# Patient Record
Sex: Female | Born: 1940
Health system: Southern US, Community
[De-identification: ages and names within clinical notes are randomized; demographics above are authoritative.]

## PROBLEM LIST (undated history)

## (undated) DIAGNOSIS — Z923 Personal history of irradiation: Secondary | ICD-10-CM

## (undated) DIAGNOSIS — M1990 Unspecified osteoarthritis, unspecified site: Secondary | ICD-10-CM

## (undated) DIAGNOSIS — C801 Malignant (primary) neoplasm, unspecified: Secondary | ICD-10-CM

## (undated) DIAGNOSIS — C50919 Malignant neoplasm of unspecified site of unspecified female breast: Secondary | ICD-10-CM

## (undated) DIAGNOSIS — E039 Hypothyroidism, unspecified: Secondary | ICD-10-CM

## (undated) HISTORY — PX: ORIF ANKLE FRACTURE: SUR919

## (undated) HISTORY — PX: ANKLE HARDWARE REMOVAL: SHX1149

---

## 1998-04-06 ENCOUNTER — Other Ambulatory Visit: Admission: RE | Admit: 1998-04-06 | Discharge: 1998-04-06 | Payer: Self-pay | Admitting: Obstetrics and Gynecology

## 2002-08-16 ENCOUNTER — Encounter: Payer: Self-pay | Admitting: Orthopedic Surgery

## 2002-08-16 ENCOUNTER — Encounter: Payer: Self-pay | Admitting: Emergency Medicine

## 2002-08-16 ENCOUNTER — Inpatient Hospital Stay (HOSPITAL_COMMUNITY): Admission: EM | Admit: 2002-08-16 | Discharge: 2002-08-19 | Payer: Self-pay | Admitting: Emergency Medicine

## 2002-11-25 ENCOUNTER — Ambulatory Visit (HOSPITAL_COMMUNITY): Admission: RE | Admit: 2002-11-25 | Discharge: 2002-11-25 | Payer: Self-pay | Admitting: Orthopedic Surgery

## 2003-09-05 ENCOUNTER — Other Ambulatory Visit: Admission: RE | Admit: 2003-09-05 | Discharge: 2003-09-05 | Payer: Self-pay | Admitting: Family Medicine

## 2003-10-22 ENCOUNTER — Encounter: Admission: RE | Admit: 2003-10-22 | Discharge: 2003-10-22 | Payer: Self-pay | Admitting: Family Medicine

## 2004-02-11 ENCOUNTER — Encounter (INDEPENDENT_AMBULATORY_CARE_PROVIDER_SITE_OTHER): Payer: Self-pay | Admitting: *Deleted

## 2004-02-11 ENCOUNTER — Ambulatory Visit (HOSPITAL_COMMUNITY): Admission: RE | Admit: 2004-02-11 | Discharge: 2004-02-11 | Payer: Self-pay | Admitting: *Deleted

## 2004-09-14 ENCOUNTER — Other Ambulatory Visit: Admission: RE | Admit: 2004-09-14 | Discharge: 2004-09-14 | Payer: Self-pay | Admitting: Family Medicine

## 2005-09-15 ENCOUNTER — Other Ambulatory Visit: Admission: RE | Admit: 2005-09-15 | Discharge: 2005-09-15 | Payer: Self-pay | Admitting: Family Medicine

## 2005-10-04 ENCOUNTER — Encounter: Admission: RE | Admit: 2005-10-04 | Discharge: 2005-10-04 | Payer: Self-pay | Admitting: Family Medicine

## 2006-04-25 ENCOUNTER — Other Ambulatory Visit: Admission: RE | Admit: 2006-04-25 | Discharge: 2006-04-25 | Payer: Self-pay | Admitting: Family Medicine

## 2006-09-18 ENCOUNTER — Other Ambulatory Visit: Admission: RE | Admit: 2006-09-18 | Discharge: 2006-09-18 | Payer: Self-pay | Admitting: Family Medicine

## 2006-10-13 ENCOUNTER — Encounter: Admission: RE | Admit: 2006-10-13 | Discharge: 2006-10-13 | Payer: Self-pay | Admitting: Family Medicine

## 2007-09-24 ENCOUNTER — Other Ambulatory Visit: Admission: RE | Admit: 2007-09-24 | Discharge: 2007-09-24 | Payer: Self-pay | Admitting: Family Medicine

## 2007-10-15 ENCOUNTER — Encounter: Admission: RE | Admit: 2007-10-15 | Discharge: 2007-10-15 | Payer: Self-pay | Admitting: Family Medicine

## 2008-06-14 ENCOUNTER — Emergency Department (HOSPITAL_COMMUNITY): Admission: EM | Admit: 2008-06-14 | Discharge: 2008-06-14 | Payer: Self-pay | Admitting: Family Medicine

## 2008-11-04 ENCOUNTER — Encounter: Admission: RE | Admit: 2008-11-04 | Discharge: 2008-11-04 | Payer: Self-pay | Admitting: Family Medicine

## 2009-10-19 ENCOUNTER — Other Ambulatory Visit: Admission: RE | Admit: 2009-10-19 | Discharge: 2009-10-19 | Payer: Self-pay | Admitting: Family Medicine

## 2010-10-13 ENCOUNTER — Other Ambulatory Visit: Payer: Self-pay | Admitting: Family Medicine

## 2010-10-13 DIAGNOSIS — Z1231 Encounter for screening mammogram for malignant neoplasm of breast: Secondary | ICD-10-CM

## 2010-10-27 ENCOUNTER — Ambulatory Visit
Admission: RE | Admit: 2010-10-27 | Discharge: 2010-10-27 | Disposition: A | Discharge status: Home / Self Care | Payer: Medicare Other | Source: Ambulatory Visit | Attending: Family Medicine | Admitting: Family Medicine

## 2010-10-27 DIAGNOSIS — Z1231 Encounter for screening mammogram for malignant neoplasm of breast: Secondary | ICD-10-CM

## 2010-10-29 ENCOUNTER — Other Ambulatory Visit: Payer: Self-pay | Admitting: Family Medicine

## 2010-10-29 DIAGNOSIS — R928 Other abnormal and inconclusive findings on diagnostic imaging of breast: Secondary | ICD-10-CM

## 2010-11-05 ENCOUNTER — Ambulatory Visit
Admission: RE | Admit: 2010-11-05 | Discharge: 2010-11-05 | Disposition: A | Discharge status: Home / Self Care | Payer: Medicare Other | Source: Ambulatory Visit | Attending: Family Medicine | Admitting: Family Medicine

## 2010-11-05 DIAGNOSIS — R928 Other abnormal and inconclusive findings on diagnostic imaging of breast: Secondary | ICD-10-CM

## 2011-01-07 NOTE — Op Note (Signed)
NAMEHAILEY, Erica Frazier                       ACCOUNT NO.:  [id number]   MEDICAL RECORD NO.:  [id number]                   PATIENT TYPE:  EMS   LOCATION:  ED                                   FACILITY:  [facility]   PHYSICIAN:  Ollen Gross, M.D.                 DATE OF BIRTH:  Aug 24, 1940   DATE OF PROCEDURE:  08/16/2002  DATE OF DISCHARGE:                                 OPERATIVE REPORT   PREOPERATIVE DIAGNOSES:  Left trimalleolar ankle fracture dislocation with  syndesmosis disruption.   POSTOPERATIVE DIAGNOSES:  Left trimalleolar ankle fracture dislocation with  syndesmosis disruption.   PROCEDURE:  Open reduction and internal fixation left trimalleolar ankle  fracture dislocation with syndesmosis fixation.   SURGEON:  Ollen Gross, M.D.   ASSISTANT:  Avel Peace, P.A.-C.   ANESTHESIA:  Spinal.   ESTIMATED BLOOD LOSS:  Minimal.   DRAINS:  None.   TOURNIQUET TIME:  70 minutes at 300 mmHg.   COMPLICATIONS:  None.   CONDITION:  Stable to recovery.   BRIEF CLINICAL NOTE:  Erica Frazier is a 70 year old female who slipped off  the step and sustained a severe trimalleolar ankle fracture dislocation with  a syndesmosis disruption. She was taken to the operating room for open  reduction and internal fixation.   DESCRIPTION OF PROCEDURE:  After successful administration of spinal  anesthetic, the tourniquet was placed high on the left thigh and left lower  extremity prepped and draped in the usual sterile fashion. The extremity was  wrapped in Esmarch, tourniquet inflated to 300 mmHg. An incision was made  over the lateral malleolus centered over the fracture. The fracture was  rather high and we did about a 6 cm incision. There was significant  comminution at the fracture site. We were able to get this anatomically  reduced and then place a six hole one third tubular plate centered over the  fracture. The bottom holes were filled with cortical screws 12 mm in length  and  the top three screws were then filled with appropriate length cortical  screws. The screw hole in the fracture site is not filled. Anatomic  reduction is confirmed on AP and lateral Fluoro spots. This effectively  reduced the joint. Under Fluoro guidance, I then grasped the distal fibula  and was able to open up the syndesmosis consistent with the syndesmosis  disruption shown on the preop x-rays. We subsequently addressed the medial  side by making an incision over the medial malleolus. She had a very unusual  sheer type fracture involving basically the entire outer cortex of the  medial malleolus. The inner cortex was fine. The joint was also fine. We  thoroughly irrigated the area and I was able to anatomically reduce this  outer fragment. I then drilled across parallel to the joint all the way to  the lateral side of the tibia and we placed a bicortical screw with a washer  and this maintained the anatomic reduction of the medial fragment and it was  extremely stable. Through the lateral incision, we then drilled for a 4  cortice syndesmosis screw. The length of the screw was 55 mm. The screw was  parallel to the joint right at the syndesmotic level. Effectively reduced  the fibula and then under Fluoro guidance I was unable to open up the  syndesmosis anymore. Both wounds were then thoroughly irrigated with saline  solution and on the medial side I repaired the tendon sheath over the  posterior tib which effectively reduced it back into the groove behind the  medial malleolus. We then closed the subcu with 2-0 Vicryl on both the  medial and lateral incisions and the skin was closed with interrupted 4-0  nylon. The tourniquet was released with a total time of 70 minutes. The  bulky sterile dressings were applied and she was placed into a posterior  splint. She was then awakened and transported to recovery in stable  condition.                                               Ollen Gross, M.D.    FA/MEDQ  D:  08/16/2002  T:  08/16/2002  Job:  [id number]

## 2011-01-07 NOTE — Op Note (Signed)
   NAMEJENNAVECIA, [name]                       ACCOUNT NO.:  [id number]   MEDICAL RECORD NO.:  [id number]                   PATIENT TYPE:  AMB   LOCATION:  DAY                                  FACILITY:  [facility]   PHYSICIAN:  Ollen Gross, M.D.                 DATE OF BIRTH:  [birth year]   DATE OF PROCEDURE:  11/25/2002  DATE OF DISCHARGE:                                 OPERATIVE REPORT   PREOPERATIVE DIAGNOSES:  Retained hardware, left ankle.   POSTOPERATIVE DIAGNOSES:  Retained hardware, left ankle.   PROCEDURE:  Hardware removal, left ankle.   SURGEON:  Ollen Gross, M.D.   ASSISTANT:  None.   ANESTHESIA:  Local with MAC.   ESTIMATED BLOOD LOSS:  Minimal.   DRAINS:  None.   COMPLICATIONS:  None.   TOURNIQUET TIME:  6 minutes at 250 mmHg   CONDITION:  Stable to recovery.   BRIEF CLINICAL NOTE:  [name] is a 70 year old female who had an ankle  fracture dislocation syndesmosis disruption about three months ago. We  treated her with ORIF and then a syndesmosis screw.  It is three months  postop now and time to remove the syndesmosis screw. She presents now for  that procedure.   DESCRIPTION OF PROCEDURE:  After successful administration of MAC  anesthetic, a tourniquet was placed around her left calf and left lower  extremity prepped and draped in the usual sterile fashion. I infiltrated the  subcu tissues over the lateral malleolus with 10 mL of 1% Xylocaine. We then  wrapped the ankle in Esmarch and inflated the tourniquet to 250. A small  incision was made over the distal most aspect of her previous incision. I  identified the syndesmosis screw and removed it with a small fragment  screwdriver. It was removed intact. We then let the tourniquet down with a  total time of 6 minutes. The skin was then closed with interrupted 4-0 nylon  after the wound was thoroughly irrigated. I then infiltrated 6 mL of 0.5%  Marcaine into the wound bed. A bulky sterile dressing  was applied. The  patient was awakened and transported to recovery in stable condition.                                               Ollen Gross, M.D.    FA/MEDQ  D:  11/25/2002  T:  11/25/2002  Job:  [id number]

## 2011-09-27 ENCOUNTER — Other Ambulatory Visit: Payer: Self-pay | Admitting: Family Medicine

## 2011-09-27 DIAGNOSIS — Z1231 Encounter for screening mammogram for malignant neoplasm of breast: Secondary | ICD-10-CM

## 2011-10-28 ENCOUNTER — Ambulatory Visit
Admission: RE | Admit: 2011-10-28 | Discharge: 2011-10-28 | Disposition: A | Discharge status: Home / Self Care | Payer: Medicare Other | Source: Ambulatory Visit | Attending: Family Medicine | Admitting: Family Medicine

## 2011-10-28 DIAGNOSIS — Z1231 Encounter for screening mammogram for malignant neoplasm of breast: Secondary | ICD-10-CM

## 2012-07-05 ENCOUNTER — Other Ambulatory Visit: Payer: Self-pay | Admitting: Family Medicine

## 2012-07-05 ENCOUNTER — Ambulatory Visit
Admission: RE | Admit: 2012-07-05 | Discharge: 2012-07-05 | Disposition: A | Discharge status: Home / Self Care | Payer: Medicare Other | Source: Ambulatory Visit | Attending: Family Medicine | Admitting: Family Medicine

## 2012-07-05 DIAGNOSIS — R52 Pain, unspecified: Secondary | ICD-10-CM

## 2012-11-28 ENCOUNTER — Other Ambulatory Visit: Payer: Self-pay

## 2012-11-28 DIAGNOSIS — Z1231 Encounter for screening mammogram for malignant neoplasm of breast: Secondary | ICD-10-CM

## 2012-12-10 ENCOUNTER — Other Ambulatory Visit (HOSPITAL_COMMUNITY)
Admission: RE | Admit: 2012-12-10 | Discharge: 2012-12-10 | Disposition: A | Discharge status: Home / Self Care | Payer: Medicare Other | Source: Ambulatory Visit | Attending: Family Medicine | Admitting: Family Medicine

## 2012-12-10 ENCOUNTER — Other Ambulatory Visit: Payer: Self-pay | Admitting: Family Medicine

## 2012-12-10 DIAGNOSIS — Z1151 Encounter for screening for human papillomavirus (HPV): Secondary | ICD-10-CM | POA: Insufficient documentation

## 2012-12-10 DIAGNOSIS — Z124 Encounter for screening for malignant neoplasm of cervix: Secondary | ICD-10-CM | POA: Insufficient documentation

## 2012-12-27 ENCOUNTER — Ambulatory Visit
Admission: RE | Admit: 2012-12-27 | Discharge: 2012-12-27 | Disposition: A | Discharge status: Home / Self Care | Payer: Medicare Other | Source: Ambulatory Visit

## 2012-12-27 DIAGNOSIS — Z1231 Encounter for screening mammogram for malignant neoplasm of breast: Secondary | ICD-10-CM

## 2014-01-14 ENCOUNTER — Other Ambulatory Visit: Payer: Self-pay

## 2014-01-14 DIAGNOSIS — Z1231 Encounter for screening mammogram for malignant neoplasm of breast: Secondary | ICD-10-CM

## 2014-01-20 ENCOUNTER — Ambulatory Visit
Admission: RE | Admit: 2014-01-20 | Discharge: 2014-01-20 | Disposition: A | Discharge status: Home / Self Care | Payer: 59 | Source: Ambulatory Visit

## 2014-01-20 ENCOUNTER — Encounter (INDEPENDENT_AMBULATORY_CARE_PROVIDER_SITE_OTHER): Payer: Self-pay

## 2014-01-20 DIAGNOSIS — Z1231 Encounter for screening mammogram for malignant neoplasm of breast: Secondary | ICD-10-CM

## 2014-08-07 ENCOUNTER — Other Ambulatory Visit: Payer: Self-pay | Admitting: Gastroenterology

## 2015-12-25 ENCOUNTER — Other Ambulatory Visit: Payer: Self-pay

## 2015-12-25 DIAGNOSIS — Z1231 Encounter for screening mammogram for malignant neoplasm of breast: Secondary | ICD-10-CM

## 2016-01-07 ENCOUNTER — Ambulatory Visit
Admission: RE | Admit: 2016-01-07 | Discharge: 2016-01-07 | Disposition: A | Discharge status: Home / Self Care | Payer: Medicare Other | Source: Ambulatory Visit

## 2016-01-07 DIAGNOSIS — Z1231 Encounter for screening mammogram for malignant neoplasm of breast: Secondary | ICD-10-CM

## 2016-08-22 DIAGNOSIS — Z923 Personal history of irradiation: Secondary | ICD-10-CM

## 2016-08-22 HISTORY — DX: Personal history of irradiation: Z92.3

## 2017-03-08 ENCOUNTER — Other Ambulatory Visit: Payer: Self-pay | Admitting: Family Medicine

## 2017-03-08 DIAGNOSIS — Z1231 Encounter for screening mammogram for malignant neoplasm of breast: Secondary | ICD-10-CM

## 2017-03-22 DIAGNOSIS — C801 Malignant (primary) neoplasm, unspecified: Secondary | ICD-10-CM

## 2017-03-22 HISTORY — DX: Malignant (primary) neoplasm, unspecified: C80.1

## 2017-03-23 ENCOUNTER — Ambulatory Visit
Admission: RE | Admit: 2017-03-23 | Discharge: 2017-03-23 | Disposition: A | Discharge status: Home / Self Care | Payer: Medicare Other | Source: Ambulatory Visit | Attending: Family Medicine | Admitting: Family Medicine

## 2017-03-23 DIAGNOSIS — Z1231 Encounter for screening mammogram for malignant neoplasm of breast: Secondary | ICD-10-CM

## 2017-03-24 ENCOUNTER — Other Ambulatory Visit: Payer: Self-pay | Admitting: Family Medicine

## 2017-03-24 DIAGNOSIS — R928 Other abnormal and inconclusive findings on diagnostic imaging of breast: Secondary | ICD-10-CM

## 2017-03-30 ENCOUNTER — Ambulatory Visit
Admission: RE | Admit: 2017-03-30 | Discharge: 2017-03-30 | Disposition: A | Discharge status: Home / Self Care | Payer: Medicare Other | Source: Ambulatory Visit | Attending: Family Medicine | Admitting: Family Medicine

## 2017-03-30 ENCOUNTER — Other Ambulatory Visit: Payer: Self-pay | Admitting: Family Medicine

## 2017-03-30 DIAGNOSIS — N6310 Unspecified lump in the right breast, unspecified quadrant: Secondary | ICD-10-CM

## 2017-03-30 DIAGNOSIS — R928 Other abnormal and inconclusive findings on diagnostic imaging of breast: Secondary | ICD-10-CM

## 2017-04-10 ENCOUNTER — Ambulatory Visit: Payer: Self-pay | Admitting: Surgery

## 2017-04-10 ENCOUNTER — Other Ambulatory Visit: Payer: Self-pay | Admitting: Surgery

## 2017-04-10 ENCOUNTER — Encounter (HOSPITAL_BASED_OUTPATIENT_CLINIC_OR_DEPARTMENT_OTHER): Payer: Self-pay | Admitting: *Deleted

## 2017-04-10 DIAGNOSIS — C50511 Malignant neoplasm of lower-outer quadrant of right female breast: Secondary | ICD-10-CM

## 2017-04-10 DIAGNOSIS — Z170 Estrogen receptor positive status [ER+]: Principal | ICD-10-CM

## 2017-04-10 NOTE — Progress Notes (Signed)
Ensure Presurgery drink given with instructions to complete by 0700 dos, hibiclens soap given with instructions, pt verbalized understanding.

## 2017-04-10 NOTE — H&P (Signed)
Erica Frazier 04/10/2017 9:19 AM Location: [facility] Patient #: [id number] DOB: 07/20/1941 Divorced / Language: English / Race: White Female  History of Present Illness (Ngozi Alvidrez A. Devlyn Parish MD; 04/10/2017 10:16 AM) Patient words: Patient sent at the request of Dr. Lin for abnormal mammogram. The patient went for screening mammogram and a 1.5 cm abnormality was detected right breast lower outer quadrant. Mammogram with ultrasound was done which showed a 1.5 cm spiculated mass. Core biopsy was done which showed invasive ductal carcinoma with DCIS ER positive PR positive HER-2/neu negative. Ki-67 was 60%. Patient denies any breast mass-discharge or other symptoms prior to this.  Family history of breast cancer. She is an artist and travels frequently.                    CLINICAL DATA: Possible mass in the posterior aspect of the lower outer right breast on a recent screening mammogram. EXAM: 2D DIGITAL DIAGNOSTIC RIGHT MAMMOGRAM WITH ADJUNCT TOMO ULTRASOUND RIGHT BREAST COMPARISON: Previous exam(s). ACR Breast Density Category b: There are scattered areas of fibroglandular density. FINDINGS: 2D and 3D spot compression views of the right breast confirm an approximately 1.5 cm irregular, spiculated mass in the posterior aspect of the lower outer quadrant of the breast. No other findings suspicious for malignancy are seen. On physical exam, there is an approximately 1.5 cm oval area of mild palpable soft tissue thickening in the 8 to 9 o'clock position of the right breast, 5 cm from the nipple. There are no palpable right axillary lymph nodes. Targeted ultrasound is performed, showing a 1.5 x 1.3 x 0.9 cm irregular, hypoechoic mass with posterior acoustical shadowing in the 8-9 o'clock position of the right breast, 5 cm from the nipple. This has some internal blood flow with power Doppler. Ultrasound of the right axilla demonstrated no abnormal appearing  right axillary lymph nodes. IMPRESSION: 1.5 cm mass in the 8-9 o'clock position of the right breast with imaging features highly suspicious for malignancy. RECOMMENDATION: Ultrasound-guided core needle biopsy of the 1.5 cm mass in the 8-9 o'clock position of the right breast. This has been discussed with the patient and is scheduled to follow. I have discussed the findings and recommendations with the patient. Results were also provided in writing at the conclusion of the visit. If applicable, a reminder letter will be sent to the patient regarding the next appointment. BI-RADS CATEGORY 5: Highly suggestive of malignancy. Electronically Signed By: Steven Reid M.D. On: 03/30/2017 09:44                  ADDITIONAL INFORMATION: PROGNOSTIC INDICATORS Results: IMMUNOHISTOCHEMICAL AND MORPHOMETRIC ANALYSIS PERFORMED MANUALLY Estrogen Receptor: 100%, POSITIVE, STRONG STAINING INTENSITY Progesterone Receptor: 100%, POSITIVE, STRONG STAINING INTENSITY Proliferation Marker Ki67: 60% REFERENCE RANGE ESTROGEN RECEPTOR NEGATIVE 0% POSITIVE =>1% REFERENCE RANGE PROGESTERONE RECEPTOR NEGATIVE 0% POSITIVE =>1% All controls stained appropriately JOHN PATRICK MD Pathologist, Electronic Signature ( Signed 04/04/2017) FLUORESCENCE IN-SITU HYBRIDIZATION Results: HER2 - NEGATIVE RATIO OF HER2/CEP17 SIGNALS 1.15 AVERAGE HER2 COPY NUMBER PER CELL 1.15 Reference Range: NEGATIVE HER2/CEP17 Ratio <2.0 and average HER2 copy number <4.0 EQUIVOCAL HER2/CEP17 Ratio <2.0 and average HER2 copy number >=4.0 and <6.0 1 of 3 FINAL for Macmaster, Anyia S ([id number]) ADDITIONAL INFORMATION:(continued) POSITIVE HER2/CEP17 Ratio >=2.0 or <2.0 and average HER2 copy number >=6.0 JOHN PATRICK MD Pathologist, Electronic Signature ( Signed 04/04/2017) FINAL DIAGNOSIS Diagnosis Breast, right, needle core biopsy, 8 o'clock - INVASIVE DUCTAL CARCINOMA. - DUCTAL CARCINOMA IN SITU. -  LYMPHOVASCULAR INVASION   IS IDENTIFIED. - SEE COMMENT. Microscopic Comment The carcinoma appears grade 1-2. A breast prognostic profile will be performed and the results reported separately. The results were called to [facility] on 03/31/17. (JBK:gt, 03/31/17) JOSHUA KISH MD Pathologist, Electronic Signature (Case signed 03/31/2017) Specimen Gross and Clinical Information Specimen Comment Mass - expect cancer Specimen(s) Obtained: Breast, right, needle core biopsy, 8 o'clock Gross Received in formalin (TIF not provided) labeled with the patient's name and "right breast 8 o'clock mass" are several fragmented cores of tan and yellow soft tissue, entirely submitted in one cassette. (AK:ah 03/30/17) Stain(s) used in Diagnosis: The following stain(s) were used in diagnosing the case: Her2 FISH, ER-ACIS, PR-ACIS, KI-67-ACIS. The control(s) stained appropriately. Disclaimer Estrogen receptor (6F11), immunohistochemical stains are performed on formalin fixed, paraffin embedded tissue using a 3,3"-diaminobenzidine (DAB) chromogen and Leica Bond Autostainer System. The staining intensity of the nucleus is scored manually and is reported as the percentage of tumor cell nuclei demonstrating specific nuclear staining.Specimens are fixed in 10% Neutral Buffered Formalin for at least 6 hours and up to 72 hours. These tests have not be validated on decalcified tissue. Results should be interpreted with caution given the possibility of false negative results on decalcified specimens. HER2 IQFISH pharmDX (code K5731) is a direct fluorescence in-situ hybridization assay designed to quantitatively determine HER2 gene amplification in formalin-fixed, paraffin-embedded tissue specimens. It is performed at [facility] and is reported using ASCO/CAP scoring criteria published in 2013. Ki-67 (MM1), immunohistochemical stains are performed on formalin fixed, paraffin embedded tissue using a  3,3"-diaminobenzidine (DAB) chromogen and Leica Bond Autostainer System. The staining intensity of the nucleus is scored manually and is reported as the percentage of tumor cell nuclei demonstrating specific nuclear staining.Specimens are fixed in 10% Neutral Buffered Formalin for at least 6 hours and up to 72 hours. These tests have not be validated on decalcified tissue. Results should be interpreted with caution given the possibility of false negative results on decalcified specimens. PR progesterone receptor (16), immunohistochemical stains are performed on formalin fixed, paraffin embedded tissue using a 3,3"-diaminobenzidine (DAB) chromogen and Leica Bond Autostainer 2 of 3 FINAL for Zuno, Bernadette S ([id number]) Disclaimer(continued) System. The staining intensity of the nucleus is scored manually and is reported as the percentage of tumor cell nuclei demonstrating specific nuclear staining.Specimens are fixed in 10% Neutral Buffered Formalin for at least 6 hours and up to 72 hours. These tests have not be validated on decalcified tissue. Results should be interpreted with caution given the possibility of false negative results on decalcified specimens. Report signed out from the following location(s) Technical Component was performed at [facility] [address],[place] [address].CLIA:[id number],CAP:[id number]., Interpretation was performed at [place] [address], [place], [place] [address].  The patient is a 76 year old female.   Past Surgical History (Christen Lambert, RMA; 04/10/2017 9:19 AM) No pertinent past surgical history  Diagnostic Studies History (Christen Lambert, RMA; 04/10/2017 9:19 AM) Colonoscopy 1-5 years ago Mammogram within last year Pap Smear 1-5 years ago  Medication History (Christen Lambert, RMA; 04/10/2017 9:21 AM) Levothyroxine Sodium (150MCG Tablet, Oral) Active. Levoxyl (150MCG Tablet, Oral)  Active. Multivitamins (Oral) Active. Medications Reconciled  Social History (Christen Lambert, RMA; 04/10/2017 9:19 AM) Alcohol use Occasional alcohol use. Caffeine use Carbonated beverages. No drug use Tobacco use Former smoker.  Family History (Christen Lambert, RMA; 04/10/2017 9:19 AM) Alcohol Abuse Mother. Hypertension Father, Mother.  Pregnancy / Birth History (Christen Lambert, RMA; 04/10/2017 9:19   AM) Age at menarche 14 years. Age of menopause 51-55 Gravida 1 Irregular periods Maternal age 21-25 Para 1  Other Problems (Christen Lambert, RMA; 04/10/2017 9:19 AM) No pertinent past medical history     Review of Systems (Christen Lambert RMA; 04/10/2017 9:20 AM) General Not Present- Appetite Loss, Chills, Fatigue, Fever, Night Sweats, Weight Gain and Weight Loss. Breast Not Present- Breast Mass, Breast Pain, Nipple Discharge and Skin Changes. Cardiovascular Not Present- Chest Pain, Difficulty Breathing Lying Down, Leg Cramps, Palpitations, Rapid Heart Rate, Shortness of Breath and Swelling of Extremities. Gastrointestinal Not Present- Abdominal Pain, Bloating, Bloody Stool, Change in Bowel Habits, Chronic diarrhea, Constipation, Difficulty Swallowing, Excessive gas, Gets full quickly at meals, Hemorrhoids, Indigestion, Nausea, Rectal Pain and Vomiting. Female Genitourinary Not Present- Frequency, Nocturia, Painful Urination, Pelvic Pain and Urgency. Neurological Not Present- Decreased Memory, Fainting, Headaches, Numbness, Seizures, Tingling, Tremor, Trouble walking and Weakness. Psychiatric Not Present- Anxiety, Bipolar, Change in Sleep Pattern, Depression, Fearful and Frequent crying. Hematology Not Present- Blood Thinners, Easy Bruising, Excessive bleeding, Gland problems, HIV and Persistent Infections.  Vitals (Christen Lambert RMA; 04/10/2017 9:21 AM) 04/10/2017 9:21 AM Weight: 158 lb Temp.: 97.5F  Pulse: 87 (Regular)  BP: 132/80 (Sitting, Left Arm,  Standard)      Physical Exam (Samariya Rockhold A. Olsen Mccutchan MD; 04/10/2017 10:17 AM)  General Mental Status-Alert. General Appearance-Consistent with stated age. Hydration-Well hydrated. Voice-Normal.  Head and Neck Head-normocephalic, atraumatic with no lesions or palpable masses. Trachea-midline. Thyroid Gland Characteristics - normal size and consistency.  Breast Note: Left breast normal. Right breast shows a vague density in the lower outer quadrant. This is about 1 cm mobile. No nipple discharge.  Cardiovascular Cardiovascular examination reveals -normal heart sounds, regular rate and rhythm with no murmurs and normal pedal pulses bilaterally.  Neurologic Neurologic evaluation reveals -alert and oriented x 3 with no impairment of recent or remote memory. Mental Status-Normal.  Musculoskeletal Normal Exam - Left-Upper Extremity Strength Normal and Lower Extremity Strength Normal. Normal Exam - Right-Upper Extremity Strength Normal and Lower Extremity Strength Normal.  Lymphatic Head & Neck  General Head & Neck Lymphatics: Bilateral - Description - Normal. Axillary  General Axillary Region: Bilateral - Description - Normal. Tenderness - Non Tender.    Assessment & Plan (Rebecca Cairns A. Kshawn Canal MD; 04/10/2017 10:18 AM)  BREAST CANCER, RIGHT (C50.911) Impression: Further medical and radiation oncology.  Patient has opted for a right breast lumpectomy with sentinel lymph node mapping. We discussed mastectomy with reconstruction as well. We discussed possible outcomes of cosmesis and potential complications of both types of operations. She is an active artist and travels and prefers Risk of lumpectomy include bleeding, infection, seroma, more surgery, use of seed/wire, wound care, cosmetic deformity and the need for other treatments, death , blood clots, death. Pt agrees to proceed. Risk of sentinel lymph node mapping include bleeding, infection, lymphedema,  shoulder pain. stiffness, dye allergy. cosmetic deformity , blood clots, death, need for more surgery. Pt agrees to proceed with breast conservation.  Current Plans You are being scheduled for surgery- Our schedulers will call you.  You should hear from our office's scheduling department within 5 working days about the location, date, and time of surgery. We try to make accommodations for patient's preferences in scheduling surgery, but sometimes the OR schedule or the surgeon's schedule prevents us from making those accommodations.  If you have not heard from our office ([phone]) in 5 working days, call the office and ask for your surgeon's nurse.  If you have other   questions about your diagnosis, plan, or surgery, call the office and ask for your surgeon's nurse.  Pt Education - [facility] Breast Cancer Information Given - [facility] "Breast Journey" Package We discussed the staging and pathophysiology of breast cancer. We discussed all of the different options for treatment for breast cancer including surgery, chemotherapy, radiation therapy, Herceptin, and antiestrogen therapy. We discussed a sentinel lymph node biopsy as she does not appear to having lymph node involvement right now. We discussed the performance of that with injection of radioactive tracer and blue dye. We discussed that she would have an incision underneath her axillary hairline. We discussed that there is a bout a 10-20% chance of having a positive node with a sentinel lymph node biopsy and we will await the permanent pathology to make any other first further decisions in terms of her treatment. One of these options might be to return to the operating room to perform an axillary lymph node dissection. We discussed about a 1-2% risk lifetime of chronic shoulder pain as well as lymphedema associated with a sentinel lymph node biopsy. We discussed the options for treatment of the breast cancer which included lumpectomy versus a  mastectomy. We discussed the performance of the lumpectomy with a wire placement. We discussed a 10-20% chance of a positive margin requiring reexcision in the operating room. We also discussed that she may need radiation therapy or antiestrogen therapy or both if she undergoes lumpectomy. We discussed the mastectomy and the postoperative care for that as well. We discussed that there is no difference in her survival whether she undergoes lumpectomy with radiation therapy or antiestrogen therapy versus a mastectomy. There is a slight difference in the local recurrence rate being 3-5% with lumpectomy and about 1% with a mastectomy. We discussed the risks of operation including bleeding, infection, possible reoperation. She understands her further therapy will be based on what her stages at the time of her operation.  Pt Education - breast cancer surgery: discussed with patient and provided information. Pt Education - ABC (After Breast Cancer) Class Info: discussed with patient and provided information. 

## 2017-04-11 ENCOUNTER — Encounter: Payer: Self-pay | Admitting: Radiation Oncology

## 2017-04-13 ENCOUNTER — Telehealth: Payer: Self-pay | Admitting: Hematology and Oncology

## 2017-04-13 ENCOUNTER — Encounter: Payer: Self-pay | Admitting: Hematology and Oncology

## 2017-04-13 NOTE — Telephone Encounter (Signed)
Appt has been scheduled for the pt to see Dr. [name] on 9/13 at 1pm. Pt aware to arrive 15 minutes early. Address and insurance verified. Letter mailed.

## 2017-04-18 ENCOUNTER — Ambulatory Visit
Admission: RE | Admit: 2017-04-18 | Discharge: 2017-04-18 | Disposition: A | Discharge status: Home / Self Care | Payer: Medicare Other | Source: Ambulatory Visit | Attending: Surgery | Admitting: Surgery

## 2017-04-18 DIAGNOSIS — C50511 Malignant neoplasm of lower-outer quadrant of right female breast: Secondary | ICD-10-CM

## 2017-04-18 DIAGNOSIS — Z170 Estrogen receptor positive status [ER+]: Principal | ICD-10-CM

## 2017-04-19 ENCOUNTER — Ambulatory Visit
Admission: RE | Admit: 2017-04-19 | Discharge: 2017-04-19 | Disposition: A | Discharge status: Home / Self Care | Payer: Medicare Other | Source: Ambulatory Visit | Attending: Surgery | Admitting: Surgery

## 2017-04-19 ENCOUNTER — Ambulatory Visit (HOSPITAL_BASED_OUTPATIENT_CLINIC_OR_DEPARTMENT_OTHER): Payer: Medicare Other | Admitting: Anesthesiology

## 2017-04-19 ENCOUNTER — Encounter (HOSPITAL_BASED_OUTPATIENT_CLINIC_OR_DEPARTMENT_OTHER)
Admission: RE | Disposition: A | Discharge status: Home / Self Care | Payer: Self-pay | Source: Ambulatory Visit | Attending: Surgery

## 2017-04-19 ENCOUNTER — Encounter (HOSPITAL_COMMUNITY)
Admission: RE | Admit: 2017-04-19 | Discharge: 2017-04-19 | Disposition: A | Discharge status: Home / Self Care | Payer: Medicare Other | Source: Ambulatory Visit | Attending: Surgery | Admitting: Surgery

## 2017-04-19 ENCOUNTER — Encounter (HOSPITAL_BASED_OUTPATIENT_CLINIC_OR_DEPARTMENT_OTHER): Payer: Self-pay | Admitting: Anesthesiology

## 2017-04-19 ENCOUNTER — Ambulatory Visit (HOSPITAL_BASED_OUTPATIENT_CLINIC_OR_DEPARTMENT_OTHER)
Admission: RE | Admit: 2017-04-19 | Discharge: 2017-04-19 | Disposition: A | Discharge status: Home / Self Care | Payer: Medicare Other | Source: Ambulatory Visit | Attending: Surgery | Admitting: Surgery

## 2017-04-19 DIAGNOSIS — Z803 Family history of malignant neoplasm of breast: Secondary | ICD-10-CM | POA: Diagnosis not present

## 2017-04-19 DIAGNOSIS — Z170 Estrogen receptor positive status [ER+]: Principal | ICD-10-CM

## 2017-04-19 DIAGNOSIS — C50511 Malignant neoplasm of lower-outer quadrant of right female breast: Secondary | ICD-10-CM | POA: Insufficient documentation

## 2017-04-19 DIAGNOSIS — Z87891 Personal history of nicotine dependence: Secondary | ICD-10-CM | POA: Diagnosis not present

## 2017-04-19 HISTORY — PX: BREAST LUMPECTOMY WITH RADIOACTIVE SEED AND SENTINEL LYMPH NODE BIOPSY: SHX6550

## 2017-04-19 HISTORY — DX: Unspecified osteoarthritis, unspecified site: M19.90

## 2017-04-19 HISTORY — DX: Hypothyroidism, unspecified: E03.9

## 2017-04-19 HISTORY — DX: Malignant (primary) neoplasm, unspecified: C80.1

## 2017-04-19 HISTORY — PX: BREAST LUMPECTOMY: SHX2

## 2017-04-19 SURGERY — BREAST LUMPECTOMY WITH RADIOACTIVE SEED AND SENTINEL LYMPH NODE BIOPSY
Anesthesia: General | Site: Breast | Laterality: Right

## 2017-04-19 MED ORDER — PROPOFOL 10 MG/ML IV BOLUS
INTRAVENOUS | Status: AC
  Filled 2017-04-19: qty 20

## 2017-04-19 MED ORDER — EPHEDRINE SULFATE 50 MG/ML IJ SOLN
INTRAMUSCULAR | Status: DC | PRN
  Administered 2017-04-19 (×2): 10 mg via INTRAVENOUS

## 2017-04-19 MED ORDER — BUPIVACAINE-EPINEPHRINE (PF) 0.25% -1:200000 IJ SOLN
INTRAMUSCULAR | Status: DC | PRN
Start: 2017-04-19 — End: 2017-04-19
  Administered 2017-04-19: 14 mL

## 2017-04-19 MED ORDER — GLYCOPYRROLATE 0.2 MG/ML IJ SOLN
INTRAMUSCULAR | Status: DC | PRN
  Administered 2017-04-19: 0.1 mg via INTRAVENOUS

## 2017-04-19 MED ORDER — TECHNETIUM TC 99M SULFUR COLLOID FILTERED
1.0000 | Freq: Once | INTRAVENOUS | Status: AC | PRN
  Administered 2017-04-19: 1 via INTRADERMAL

## 2017-04-19 MED ORDER — FENTANYL CITRATE (PF) 100 MCG/2ML IJ SOLN
50.0000 ug | INTRAMUSCULAR | Status: DC | PRN
  Administered 2017-04-19: 100 ug via INTRAVENOUS

## 2017-04-19 MED ORDER — CHLORHEXIDINE GLUCONATE CLOTH 2 % EX PADS: 6.0000 | MEDICATED_PAD | Freq: Once | CUTANEOUS | Status: DC

## 2017-04-19 MED ORDER — LACTATED RINGERS IV SOLN
INTRAVENOUS | Status: DC
  Administered 2017-04-19: 10:00:00 via INTRAVENOUS

## 2017-04-19 MED ORDER — PROPOFOL 10 MG/ML IV BOLUS
INTRAVENOUS | Status: DC | PRN
  Administered 2017-04-19: 150 mg via INTRAVENOUS

## 2017-04-19 MED ORDER — FENTANYL CITRATE (PF) 100 MCG/2ML IJ SOLN
INTRAMUSCULAR | Status: AC
  Filled 2017-04-19: qty 2

## 2017-04-19 MED ORDER — MEPERIDINE HCL 25 MG/ML IJ SOLN: 6.2500 mg | INTRAMUSCULAR | Status: DC | PRN

## 2017-04-19 MED ORDER — DEXAMETHASONE SODIUM PHOSPHATE 4 MG/ML IJ SOLN
INTRAMUSCULAR | Status: DC | PRN
  Administered 2017-04-19: 10 mg via INTRAVENOUS

## 2017-04-19 MED ORDER — HYDROCODONE-ACETAMINOPHEN 5-325 MG PO TABS: 1.0000 | ORAL_TABLET | Freq: Four times a day (QID) | ORAL | 0 refills | Status: DC | PRN

## 2017-04-19 MED ORDER — ROPIVACAINE HCL 5 MG/ML IJ SOLN
INTRAMUSCULAR | Status: DC | PRN
  Administered 2017-04-19: 25 mL via PERINEURAL

## 2017-04-19 MED ORDER — SCOPOLAMINE 1 MG/3DAYS TD PT72: 1.0000 | MEDICATED_PATCH | Freq: Once | TRANSDERMAL | Status: DC | PRN

## 2017-04-19 MED ORDER — LIDOCAINE HCL (CARDIAC) 20 MG/ML IV SOLN
INTRAVENOUS | Status: DC | PRN
  Administered 2017-04-19: 30 mg via INTRAVENOUS

## 2017-04-19 MED ORDER — ONDANSETRON HCL 4 MG/2ML IJ SOLN
INTRAMUSCULAR | Status: AC
  Filled 2017-04-19: qty 2

## 2017-04-19 MED ORDER — LIDOCAINE 2% (20 MG/ML) 5 ML SYRINGE
INTRAMUSCULAR | Status: AC
  Filled 2017-04-19: qty 5

## 2017-04-19 MED ORDER — MIDAZOLAM HCL 2 MG/2ML IJ SOLN
1.0000 mg | INTRAMUSCULAR | Status: DC | PRN
  Administered 2017-04-19 (×2): 1 mg via INTRAVENOUS

## 2017-04-19 MED ORDER — MIDAZOLAM HCL 2 MG/2ML IJ SOLN
INTRAMUSCULAR | Status: AC
  Filled 2017-04-19: qty 2

## 2017-04-19 MED ORDER — CELECOXIB 400 MG PO CAPS: 400.0000 mg | ORAL_CAPSULE | ORAL | Status: DC

## 2017-04-19 MED ORDER — FENTANYL CITRATE (PF) 100 MCG/2ML IJ SOLN: 25.0000 ug | INTRAMUSCULAR | Status: DC | PRN

## 2017-04-19 MED ORDER — PHENYLEPHRINE HCL 10 MG/ML IJ SOLN
INTRAMUSCULAR | Status: DC | PRN
  Administered 2017-04-19: 80 ug via INTRAVENOUS
  Administered 2017-04-19: 40 ug via INTRAVENOUS
  Administered 2017-04-19: 80 ug via INTRAVENOUS

## 2017-04-19 MED ORDER — FENTANYL CITRATE (PF) 100 MCG/2ML IJ SOLN
INTRAMUSCULAR | Status: DC | PRN
  Administered 2017-04-19: 100 ug via INTRAVENOUS

## 2017-04-19 MED ORDER — GABAPENTIN 300 MG PO CAPS
ORAL_CAPSULE | ORAL | Status: AC
  Filled 2017-04-19: qty 1

## 2017-04-19 MED ORDER — MIDAZOLAM HCL 5 MG/5ML IJ SOLN
INTRAMUSCULAR | Status: DC | PRN
  Administered 2017-04-19: 2 mg via INTRAVENOUS

## 2017-04-19 MED ORDER — EPHEDRINE 5 MG/ML INJ
INTRAVENOUS | Status: AC
  Filled 2017-04-19: qty 10

## 2017-04-19 MED ORDER — GABAPENTIN 300 MG PO CAPS
300.0000 mg | ORAL_CAPSULE | ORAL | Status: AC
  Administered 2017-04-19: 300 mg via ORAL

## 2017-04-19 MED ORDER — DEXTROSE 5 % IV SOLN: 3.0000 g | INTRAVENOUS | Status: DC

## 2017-04-19 MED ORDER — PHENYLEPHRINE 40 MCG/ML (10ML) SYRINGE FOR IV PUSH (FOR BLOOD PRESSURE SUPPORT)
PREFILLED_SYRINGE | INTRAVENOUS | Status: AC
  Filled 2017-04-19: qty 10

## 2017-04-19 MED ORDER — CEFAZOLIN SODIUM-DEXTROSE 2-4 GM/100ML-% IV SOLN
INTRAVENOUS | Status: AC
  Filled 2017-04-19: qty 100

## 2017-04-19 MED ORDER — DEXAMETHASONE SODIUM PHOSPHATE 10 MG/ML IJ SOLN
INTRAMUSCULAR | Status: AC
  Filled 2017-04-19: qty 1

## 2017-04-19 MED ORDER — CEFAZOLIN SODIUM-DEXTROSE 2-4 GM/100ML-% IV SOLN
2.0000 g | Freq: Once | INTRAVENOUS | Status: AC
  Administered 2017-04-19: 2 g via INTRAVENOUS

## 2017-04-19 MED ORDER — ACETAMINOPHEN 500 MG PO TABS
1000.0000 mg | ORAL_TABLET | ORAL | Status: AC
  Administered 2017-04-19: 1000 mg via ORAL

## 2017-04-19 MED ORDER — ACETAMINOPHEN 500 MG PO TABS
ORAL_TABLET | ORAL | Status: AC
  Filled 2017-04-19: qty 2

## 2017-04-19 SURGICAL SUPPLY — 51 items
ADH SKN CLS APL DERMABOND .7 (GAUZE/BANDAGES/DRESSINGS) ×1
APPLIER CLIP 9.375 MED OPEN (MISCELLANEOUS) ×3
APR CLP MED 9.3 20 MLT OPN (MISCELLANEOUS) ×1
BINDER BREAST LRG (GAUZE/BANDAGES/DRESSINGS) IMPLANT
BINDER BREAST XLRG (GAUZE/BANDAGES/DRESSINGS) ×2 IMPLANT
BLADE SURG 15 STRL LF DISP TIS (BLADE) ×1 IMPLANT
BLADE SURG 15 STRL SS (BLADE) ×3
CANISTER SUC SOCK COL 7IN (MISCELLANEOUS) IMPLANT
CANISTER SUCT 1200ML W/VALVE (MISCELLANEOUS) ×3 IMPLANT
CHLORAPREP W/TINT 26ML (MISCELLANEOUS) ×3 IMPLANT
CLIP APPLIE 9.375 MED OPEN (MISCELLANEOUS) ×1 IMPLANT
COVER BACK TABLE 60X90IN (DRAPES) ×3 IMPLANT
COVER MAYO STAND STRL (DRAPES) ×3 IMPLANT
COVER PROBE W GEL 5X96 (DRAPES) ×3 IMPLANT
DECANTER SPIKE VIAL GLASS SM (MISCELLANEOUS) IMPLANT
DERMABOND ADVANCED (GAUZE/BANDAGES/DRESSINGS) ×2
DERMABOND ADVANCED .7 DNX12 (GAUZE/BANDAGES/DRESSINGS) ×1 IMPLANT
DEVICE DUBIN W/COMP PLATE 8390 (MISCELLANEOUS) ×3 IMPLANT
DRAPE LAPAROSCOPIC ABDOMINAL (DRAPES) ×3 IMPLANT
DRAPE UTILITY XL STRL (DRAPES) ×3 IMPLANT
ELECT COATED BLADE 2.86 ST (ELECTRODE) ×3 IMPLANT
ELECT REM PT RETURN 9FT ADLT (ELECTROSURGICAL) ×3
ELECTRODE REM PT RTRN 9FT ADLT (ELECTROSURGICAL) ×1 IMPLANT
GLOVE BIOGEL PI IND STRL 7.0 (GLOVE) ×2 IMPLANT
GLOVE BIOGEL PI IND STRL 8 (GLOVE) ×1 IMPLANT
GLOVE BIOGEL PI INDICATOR 7.0 (GLOVE) ×4
GLOVE BIOGEL PI INDICATOR 8 (GLOVE) ×2
GLOVE ECLIPSE 6.5 STRL STRAW (GLOVE) ×3 IMPLANT
GLOVE ECLIPSE 8.0 STRL XLNG CF (GLOVE) ×3 IMPLANT
GOWN STRL REUS W/ TWL LRG LVL3 (GOWN DISPOSABLE) ×2 IMPLANT
GOWN STRL REUS W/TWL LRG LVL3 (GOWN DISPOSABLE) ×6
HEMOSTAT ARISTA ABSORB 3G PWDR (MISCELLANEOUS) IMPLANT
HEMOSTAT SNOW SURGICEL 2X4 (HEMOSTASIS) ×3 IMPLANT
KIT MARKER MARGIN INK (KITS) ×3 IMPLANT
NDL HYPO 25X1 1.5 SAFETY (NEEDLE) ×1 IMPLANT
NDL SAFETY ECLIPSE 18X1.5 (NEEDLE) IMPLANT
NEEDLE HYPO 18GX1.5 SHARP (NEEDLE)
NEEDLE HYPO 25X1 1.5 SAFETY (NEEDLE) ×3 IMPLANT
NS IRRIG 1000ML POUR BTL (IV SOLUTION) ×3 IMPLANT
PACK BASIN DAY SURGERY FS (CUSTOM PROCEDURE TRAY) ×3 IMPLANT
PENCIL BUTTON HOLSTER BLD 10FT (ELECTRODE) ×3 IMPLANT
SLEEVE SCD COMPRESS KNEE MED (MISCELLANEOUS) ×3 IMPLANT
SPONGE LAP 4X18 X RAY DECT (DISPOSABLE) ×3 IMPLANT
SUT MNCRL AB 4-0 PS2 18 (SUTURE) ×3 IMPLANT
SUT VICRYL 3-0 CR8 SH (SUTURE) ×3 IMPLANT
SYR CONTROL 10ML LL (SYRINGE) ×3 IMPLANT
TOWEL OR 17X24 6PK STRL BLUE (TOWEL DISPOSABLE) ×3 IMPLANT
TOWEL OR NON WOVEN STRL DISP B (DISPOSABLE) ×3 IMPLANT
TUBE CONNECTING 20'X1/4 (TUBING) ×1
TUBE CONNECTING 20X1/4 (TUBING) ×2 IMPLANT
YANKAUER SUCT BULB TIP NO VENT (SUCTIONS) ×3 IMPLANT

## 2017-04-19 NOTE — Anesthesia Procedure Notes (Signed)
Anesthesia Regional Block: Pectoralis block   Pre-Anesthetic Checklist: ,, timeout performed, Correct Patient, Correct Site, Correct Laterality, Correct Procedure, Correct Position, site marked, Risks and benefits discussed,  Surgical consent,  Pre-op evaluation,  At surgeon's request and post-op pain management  Laterality: Right  Prep: chloraprep       Needles:  Injection technique: Single-shot  Needle Type: Echogenic Needle     Needle Length: 5cm  Needle Gauge: 21     Additional Needles:   Procedures: ultrasound guided,,,,,,,,  Narrative:  Start time: 04/19/2017 10:02 AM End time: 04/19/2017 10:08 AM Injection made incrementally with aspirations every 5 mL.  Performed by: Personally  Anesthesiologist: Kainoah Bartosiewicz

## 2017-04-19 NOTE — Progress Notes (Signed)
Emotional support during breast injections °

## 2017-04-19 NOTE — H&P (View-Only) (Signed)
[place] 04/10/2017 9:19 AM Location: [facility] Patient #: [id number] DOB: April 09, 1941 Divorced / Language: Erica Frazier / Race: White Female  History of Present Illness Erica Frazier A. Erica Rhines MD; 04/10/2017 10:16 AM) Patient words: Patient sent at the request of Dr. Augustin Coupe for abnormal mammogram. The patient went for screening mammogram and a 1.5 cm abnormality was detected right breast lower outer quadrant. Mammogram with ultrasound was done which showed a 1.5 cm spiculated mass. Core biopsy was done which showed invasive ductal carcinoma with DCIS ER positive PR positive HER-2/neu negative. Ki-67 was 60%. Patient denies any breast mass-discharge or other symptoms prior to this.  Family history of breast cancer. 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                    CLINICAL DATA: Possible mass in the posterior aspect of the lower outer right breast on a recent screening mammogram. EXAM: 2D DIGITAL DIAGNOSTIC RIGHT MAMMOGRAM WITH ADJUNCT TOMO ULTRASOUND RIGHT BREAST COMPARISON: Previous exam(s). ACR Breast Density Category b: There are scattered areas of fibroglandular density. FINDINGS: 2D and 3D spot compression views of the right breast confirm an approximately 1.5 cm irregular, spiculated mass in the posterior aspect of the lower outer quadrant of the breast. No other findings suspicious for malignancy are seen. On physical exam, there is an approximately 1.5 cm oval area of mild palpable soft tissue thickening in the 8 to 9 o'clock position of the right breast, 5 cm from the nipple. There are no palpable right axillary lymph nodes. Targeted ultrasound is performed, showing a 1.5 x 1.3 x 0.9 cm irregular, hypoechoic mass with posterior acoustical shadowing in the 8-9 o'clock position of the right breast, 5 cm from the nipple. This has some internal blood flow with power Doppler. Ultrasound of the right axilla demonstrated no abnormal appearing  right axillary lymph nodes. IMPRESSION: 1.5 cm mass in the 8-9 o'clock position of the right breast with imaging features highly suspicious for malignancy. RECOMMENDATION: Ultrasound-guided core needle biopsy of the 1.5 cm mass in the 8-9 o'clock position of the right breast. This has been discussed with the patient and is scheduled to follow. I have discussed the findings and recommendations with the patient. Results were also provided in writing at the conclusion of the visit. If applicable, a reminder letter will be sent to the patient regarding the next appointment. BI-RADS CATEGORY 5: Highly suggestive of malignancy. Electronically Signed By: Erica Frazier M.D. On: 03/30/2017 09:44                  ADDITIONAL INFORMATION: PROGNOSTIC INDICATORS Results: IMMUNOHISTOCHEMICAL AND MORPHOMETRIC ANALYSIS PERFORMED MANUALLY Estrogen Receptor: 100%, POSITIVE, STRONG STAINING INTENSITY Progesterone Receptor: 100%, POSITIVE, STRONG STAINING INTENSITY Proliferation Marker Ki67: 60% REFERENCE RANGE ESTROGEN RECEPTOR NEGATIVE 0% POSITIVE =>1% REFERENCE RANGE PROGESTERONE RECEPTOR NEGATIVE 0% POSITIVE =>1% All controls stained appropriately Erica Laws MD Pathologist, Electronic Signature ( Signed 04/04/2017) FLUORESCENCE IN-SITU HYBRIDIZATION Results: HER2 - NEGATIVE RATIO OF HER2/CEP17 SIGNALS 1.15 AVERAGE HER2 COPY NUMBER PER CELL 1.15 Reference Range: NEGATIVE HER2/CEP17 Ratio <2.0 and average HER2 copy number <4.0 EQUIVOCAL HER2/CEP17 Ratio <2.0 and average HER2 copy number >=4.0 and <6.0 1 of 3 FINAL for Erica Frazier [phone]) ADDITIONAL INFORMATION:(continued) POSITIVE HER2/CEP17 Ratio >=2.0 or <2.0 and average HER2 copy number >=6.0 Erica Laws MD Pathologist, Electronic Signature ( Signed 04/04/2017) FINAL DIAGNOSIS Diagnosis Breast, right, needle core biopsy, 8 o'clock - INVASIVE DUCTAL CARCINOMA. - DUCTAL CARCINOMA IN SITU. -  LYMPHOVASCULAR INVASION  IS IDENTIFIED. - SEE COMMENT. Microscopic Comment The carcinoma appears grade 1-2. A breast prognostic profile will be performed and the results reported separately. The results were called to [facility] on 03/31/17. (JBK:gt, 03/31/17) Erica Cutter MD Pathologist, Electronic Signature (Case signed 03/31/2017) Specimen Gross and Clinical Information Specimen Comment Mass - expect cancer Specimen(s) Obtained: Breast, right, needle core biopsy, 8 o'clock Gross Received in formalin (TIF not provided) labeled with the patient's name and "right breast 8 o'clock mass" are several fragmented cores of tan and yellow soft tissue, entirely submitted in one cassette. (AK:ah 03/30/17) Stain(s) used in Diagnosis: The following stain(s) were used in diagnosing the case: Her2 FISH, ER-ACIS, PR-ACIS, KI-67-ACIS. The control(s) stained appropriately. Disclaimer Estrogen receptor (6F11), immunohistochemical stains are performed on formalin fixed, paraffin embedded tissue using a 3,3"-diaminobenzidine (DAB) chromogen and Leica Bond Autostainer System. The staining intensity of the nucleus is scored manually and is reported as the percentage of tumor cell nuclei demonstrating specific nuclear staining.Specimens are fixed in 10% Neutral Buffered Formalin for at least 6 hours and up to 72 hours. These tests have not be validated on decalcified tissue. Results should be interpreted with caution given the possibility of false negative results on decalcified specimens. HER2 IQFISH pharmDX (code [phone]) is a direct fluorescence in-situ hybridization assay designed to quantitatively determine HER2 gene amplification in formalin-fixed, paraffin-embedded tissue specimens. It is performed at [facility] and is reported using ASCO/CAP scoring criteria published in 2013. Ki-67 (MM1), immunohistochemical stains are performed on formalin fixed, paraffin embedded tissue using a  3,3"-diaminobenzidine (DAB) chromogen and Leica Bond Autostainer System. The staining intensity of the nucleus is scored manually and is reported as the percentage of tumor cell nuclei demonstrating specific nuclear staining.Specimens are fixed in 10% Neutral Buffered Formalin for at least 6 hours and up to 72 hours. These tests have not be validated on decalcified tissue. Results should be interpreted with caution given the possibility of false negative results on decalcified specimens. PR progesterone receptor (16), immunohistochemical stains are performed on formalin fixed, paraffin embedded tissue using a 3,3"-diaminobenzidine (DAB) chromogen and Leica Bond Autostainer 2 of 3 FINAL for LILIT, CINELLI ([id number]) Disclaimer(continued) System. The staining intensity of the nucleus is scored manually and is reported as the percentage of tumor cell nuclei demonstrating specific nuclear staining.Specimens are fixed in 10% Neutral Buffered Formalin for at least 6 hours and up to 72 hours. These tests have not be validated on decalcified tissue. Results should be interpreted with caution given the possibility of false negative results on decalcified specimens. Report signed out from the following location(s) Technical Component was performed at [facility]. [address],[place] [address].DVVO:[id number]., Interpretation was performed at [place], [place],  [address].  The patient is a 76 year old female.   Past Surgical History Erica Frazier, [place]; 04/10/2017 9:19 AM) No pertinent past surgical history  Diagnostic Studies History Erica Frazier, [place]; 04/10/2017 9:19 AM) Colonoscopy 1-5 years ago Mammogram within last year Pap Smear 1-5 years ago  Medication History Erica Frazier, RMA; 04/10/2017 9:21 AM) Levothyroxine Sodium (150MCG Tablet, Oral) Active. Levoxyl (150MCG Tablet, Oral)  Active. Multivitamins (Oral) Active. Medications Reconciled  Social History Erica Frazier, [place]; 04/10/2017 9:19 AM) Alcohol use Occasional alcohol use. Caffeine use Carbonated beverages. No drug use Tobacco use Former smoker.  Family History Erica Frazier, [place]; 04/10/2017 9:19 AM) Alcohol Abuse Mother. Hypertension Father, Mother.  Pregnancy / Birth History Erica Frazier, [place]; 04/10/2017 9:19  AM) Age at menarche 50 years. Age of menopause 93-55 Gravida 1 Irregular periods Maternal age 66-25 Para 1  Other Problems Erica Frazier, [place]; 04/10/2017 9:19 AM) No pertinent past medical history     Review of Systems Erica Frazier RMA; 04/10/2017 9:20 AM) General Not Present- Appetite Loss, Chills, Fatigue, Fever, Night Sweats, Weight Gain and Weight Loss. Breast Not Present- Breast Mass, Breast Pain, Nipple Discharge and Skin Changes. Cardiovascular Not Present- Chest Pain, Difficulty Breathing Lying Down, Leg Cramps, Palpitations, Rapid Heart Rate, Shortness of Breath and Swelling of Extremities. Gastrointestinal Not Present- Abdominal Pain, Bloating, Bloody Stool, Change in Bowel Habits, Chronic diarrhea, Constipation, Difficulty Swallowing, Excessive gas, Gets full quickly at meals, Hemorrhoids, Indigestion, Nausea, Rectal Pain and Vomiting. Female Genitourinary Not Present- Frequency, Nocturia, Painful Urination, Pelvic Pain and Urgency. Neurological Not Present- Decreased Memory, Fainting, Headaches, Numbness, Seizures, Tingling, Tremor, Trouble walking and Weakness. Psychiatric Not Present- Anxiety, Bipolar, Change in Sleep Pattern, Depression, Fearful and Frequent crying. Hematology Not Present- Blood Thinners, Easy Bruising, Excessive bleeding, Gland problems, HIV and Persistent Infections.  Vitals Erica Frazier RMA; 04/10/2017 9:21 AM) 04/10/2017 9:21 AM Weight: 158 lb Temp.: 97.24F  Pulse: 87 (Regular)  BP: 132/80 (Sitting, Left Arm,  Standard)      Physical Exam (Erica Heinze A. Avett Reineck MD; 04/10/2017 10:17 AM)  General Mental Status-Alert. General Appearance-Consistent with stated age. Hydration-Well hydrated. Voice-Normal.  Head and Neck Head-normocephalic, atraumatic with no lesions or palpable masses. Trachea-midline. Thyroid Gland Characteristics - normal size and consistency.  Breast Note: Left breast normal. Right breast shows a vague density in the lower outer quadrant. This is about 1 cm mobile. No nipple discharge.  Cardiovascular Cardiovascular examination reveals -normal heart sounds, regular rate and rhythm with no murmurs and normal pedal pulses bilaterally.  Neurologic Neurologic evaluation reveals -alert and oriented x 3 with no impairment of recent or remote memory. Mental Status-Normal.  Musculoskeletal Normal Exam - Left-Upper Extremity Strength Normal and Lower Extremity Strength Normal. Normal Exam - Right-Upper Extremity Strength Normal and Lower Extremity Strength Normal.  Lymphatic Head & Neck  General Head & Neck Lymphatics: Bilateral - Description - Normal. Axillary  General Axillary Region: Bilateral - Description - Normal. Tenderness - Non Tender.    Assessment & Plan (Erica Bhattacharyya A. Justice Aguirre MD; 04/10/2017 10:18 AM)  BREAST CANCER, RIGHT (C50.911) Impression: Further medical and radiation oncology.  Patient has opted for a right breast lumpectomy with sentinel lymph node mapping. We discussed mastectomy with reconstruction as well. We discussed possible outcomes of cosmesis and potential complications of both types of operations. 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 Pt agrees to proceed. Risk of sentinel lymph node mapping include bleeding, infection, lymphedema,  shoulder pain. stiffness, dye allergy. cosmetic deformity , blood clots, death, need for more surgery. Pt agrees to proceed with breast conservation.  Current Plans You are being scheduled for surgery- Our schedulers will call you.  You should hear from our office's scheduling department within 5 working days about the location, date, and time of surgery. We try to make accommodations for patient's preferences in scheduling surgery, but sometimes the OR schedule or the surgeon's schedule prevents [place] from making those accommodations.  If you have not heard from our office [phone]) in 5 working days, call the office and ask for your surgeon's nurse.  If you have other  questions about your diagnosis, plan, or surgery, call the office and ask for your surgeon's nurse.  Pt Education - [facility] Breast Cancer Information Given - [facility] "Breast Journey" Package We discussed the staging and pathophysiology of breast cancer. We discussed all of the different options for treatment for breast cancer including surgery, chemotherapy, radiation therapy, Herceptin, and antiestrogen therapy. We discussed a sentinel lymph node biopsy as she does not appear to having lymph node involvement right now. We discussed the performance of that with injection of radioactive tracer and blue dye. We discussed that she would have an incision underneath her axillary hairline. We discussed that there is a bout a 10-20% chance of having a positive node with a sentinel lymph node biopsy and we will await the permanent pathology to make any other first further decisions in terms of her treatment. One of these options might be to return to the operating room to perform an axillary lymph node dissection. We discussed about a 1-2% risk lifetime of chronic shoulder pain as well as lymphedema associated with a sentinel lymph node biopsy. We discussed the options for treatment of the breast cancer which included lumpectomy versus a  mastectomy. We discussed the performance of the lumpectomy with a wire placement. We discussed a 10-20% chance of a positive margin requiring reexcision in the operating room. We also discussed that she may need radiation therapy or antiestrogen therapy or both if she undergoes lumpectomy. We discussed the mastectomy and the postoperative care for that as well. We discussed that there is no difference in her survival whether she undergoes lumpectomy with radiation therapy or antiestrogen therapy versus a mastectomy. There is a slight difference in the local recurrence rate being 3-5% with lumpectomy and about 1% with a mastectomy. We discussed the risks of operation including bleeding, infection, possible reoperation. She understands her further therapy will be based on what her stages at the time of her operation.  Pt Education - breast cancer surgery: discussed with patient and provided information. Pt Education - ABC (After Breast Cancer) Class Info: discussed with patient and provided information.

## 2017-04-19 NOTE — Op Note (Signed)
Preoperative diagnosis: Stage I right  breast cancer lower -outer quadrant   Postoperative diagnosis: Same   Procedure: right  breast seed localized lumpectomy with right  Axillary deep  sentinel lymph node mapping  Surgeon: Erroll Luna M.D.   Anesthesia: LMA with pectoral block anesthesia  And local   EBL: 20 cc   Specimen: right  breast mass with clip and seed to pathology and two  axillary sentinel nodes  hot   Drains: None   Indications for procedure: Patient presents for treatment of her right  breast cancer. She has opted for breast conservation after lengthy discussion of treatment options to include breast conservation surgery and mastectomy and reconstruction. Risks, benefits and alternatives discussed with the patient.The procedure has been discussed with the patient. Alternatives to surgery have been discussed with the patient. Risks of surgery include bleeding, Infection, Seroma formation, death, and the need for further surgery. The patient understands and wishes to proceed. Sentinel lymph node mapping and dissection has been discussed with the patient. Risk of bleeding, Infection, Seroma formation, Additional procedures, Shoulder weakness , Shoulder stiffness, Nerve and blood vessel injury and reaction to the mapping dyes have been discussed. Alternatives to surgery have been discussed with the patient. The patient agrees to proceed.   Description of procedure: Patient underwent placement of right  breast seed placement in  radiology earlier in the week. She presents to the holding area and questions are answered. Patient underwent technetium sulfur colloid injection per protocol. Questions answered. Patient taken back to operating room and placed supine on the operating room table. Patient received 2 g of Ancef. After induction of LMA anesthesia right  breast was prepped and draped in a sterile fashion. Of note, patient had pectoral block by anesthesia prior to this. Neoprobe was  used to identify the radioactive seen in the right  lower-outer quadrant. Curvilinear incision made around lateral nipple  and dissection was carried around to excise all tissue around both the clip and seed. Radiograph showed the mass with gross negative margins. Both seed and clip were in the specimen. Specimen sent to pathology.   Neoprobe was switched to the technetium sulfur colloid setting. Hot spot identify the right axilla. Incision made in the inferior axillary hairline and dissection carried into the deep  axilla.  2 Hot and blue lymph node identified and excised. Background counts approached 0. Wound  was irrigated and closed with 3-0 Vicryl and 4-0 Monocryl. Lumpectomy site closed in a similar fashion. Dermabond applied. All final counts of sponge, needle and instruments found to be correct at this point. Patient awoke, taken to recovery in satisfactory condition.

## 2017-04-19 NOTE — Progress Notes (Signed)
Assisted Dr. Oddono with right, ultrasound guided, pectoralis block. Side rails up, monitors on throughout procedure. See vital signs in flow sheet. Tolerated Procedure well. 

## 2017-04-19 NOTE — Anesthesia Procedure Notes (Signed)
Procedure Name: LMA Insertion Date/Time: 04/19/2017 11:25 AM Performed by: Toula Moos L Pre-anesthesia Checklist: Patient identified, Emergency Drugs available, Suction available, Patient being monitored and Timeout performed Patient Re-evaluated:Patient Re-evaluated prior to induction Oxygen Delivery Method: Circle system utilized Preoxygenation: Pre-oxygenation with 100% oxygen Induction Type: IV induction Ventilation: Mask ventilation without difficulty LMA: LMA inserted LMA Size: 4.0 Number of attempts: 1 Airway Equipment and Method: Bite block Placement Confirmation: positive ETCO2 Tube secured with: Tape Dental Injury: Teeth and Oropharynx as per pre-operative assessment

## 2017-04-19 NOTE — Discharge Instructions (Signed)
[facility] °Office Phone Number [phone] ° °BREAST BIOPSY/ PARTIAL MASTECTOMY: POST OP INSTRUCTIONS ° °Always review your discharge instruction sheet given to you by the facility where your surgery was performed. ° °IF YOU HAVE DISABILITY OR FAMILY LEAVE FORMS, YOU MUST BRING THEM TO THE OFFICE FOR PROCESSING.  DO NOT GIVE THEM TO YOUR DOCTOR. ° °1. A prescription for pain medication may be given to you upon discharge.  Take your pain medication as prescribed, if needed.  If narcotic pain medicine is not needed, then you may take acetaminophen (Tylenol) or ibuprofen (Advil) as needed. °2. Take your usually prescribed medications unless otherwise directed °3. If you need a refill on your pain medication, please contact your pharmacy.  They will contact our office to request authorization.  Prescriptions will not be filled after 5pm or on week-ends. °4. You should eat very light the first 24 hours after surgery, such as soup, crackers, pudding, etc.  Resume your normal diet the day after surgery. °5. Most patients will experience some swelling and bruising in the breast.  Ice packs and a good support bra will help.  Swelling and bruising can take several days to resolve.  °6. It is common to experience some constipation if taking pain medication after surgery.  Increasing fluid intake and taking a stool softener will usually help or prevent this problem from occurring.  A mild laxative (Milk of Magnesia or Miralax) should be taken according to package directions if there are no bowel movements after 48 hours. °7. Unless discharge instructions indicate otherwise, you may remove your bandages 24-48 hours after surgery, and you may shower at that time.  You may have steri-strips (small skin tapes) in place directly over the incision.  These strips should be left on the skin for 7-10 days.  If your surgeon used skin glue on the incision, you may shower in 24 hours.  The glue will flake off over the  next 2-3 weeks.  Any sutures or staples will be removed at the office during your follow-up visit. °8. ACTIVITIES:  You may resume regular daily activities (gradually increasing) beginning the next day.  Wearing a good support bra or sports bra minimizes pain and swelling.  You may have sexual intercourse when it is comfortable. °a. You may drive when you no longer are taking prescription pain medication, you can comfortably wear a seatbelt, and you can safely maneuver your car and apply brakes. °b. RETURN TO WORK:  ______________________________________________________________________________________ °9. You should see your doctor in the office for a follow-up appointment approximately two weeks after your surgery.  Your doctor’s nurse will typically make your follow-up appointment when she calls you with your pathology report.  Expect your pathology report 2-3 business days after your surgery.  You may call to check if you do not hear from us after three days. °10. OTHER INSTRUCTIONS: _______________________________________________________________________________________________ _____________________________________________________________________________________________________________________________________ °_____________________________________________________________________________________________________________________________________ °_____________________________________________________________________________________________________________________________________ ° °WHEN TO CALL YOUR DOCTOR: °1. Fever over 101.0 °2. Nausea and/or vomiting. °3. Extreme swelling or bruising. °4. Continued bleeding from incision. °5. Increased pain, redness, or drainage from the incision. ° °The clinic staff is available to answer your questions during regular business hours.  Please don’t hesitate to call and ask to speak to one of the nurses for clinical concerns.  If you have a medical emergency, go to the nearest  emergency room or call 911.  A surgeon from [facility] is always on call at the hospital. ° °For further questions, please visit [email or web address]  ° ° ° ° °  Post Anesthesia Home Care Instructions ° °Activity: °Get plenty of rest for the remainder of the day. A responsible individual must stay with you for 24 hours following the procedure.  °For the next 24 hours, DO NOT: °-Drive a car °-Operate machinery °-Drink alcoholic beverages °-Take any medication unless instructed by your physician °-Make any legal decisions or sign important papers. ° °Meals: °Start with liquid foods such as gelatin or soup. Progress to regular foods as tolerated. Avoid greasy, spicy, heavy foods. If nausea and/or vomiting occur, drink only clear liquids until the nausea and/or vomiting subsides. Call your physician if vomiting continues. ° °Special Instructions/Symptoms: °Your throat may feel dry or sore from the anesthesia or the breathing tube placed in your throat during surgery. If this causes discomfort, gargle with warm salt water. The discomfort should disappear within 24 hours. ° °If you had a scopolamine patch placed behind your ear for the management of post- operative nausea and/or vomiting: ° °1. The medication in the patch is effective for 72 hours, after which it should be removed.  Wrap patch in a tissue and discard in the trash. Wash hands thoroughly with soap and water. °2. You may remove the patch earlier than 72 hours if you experience unpleasant side effects which may include dry mouth, dizziness or visual disturbances. °3. Avoid touching the patch. Wash your hands with soap and water after contact with the patch. °  ° °

## 2017-04-19 NOTE — Interval H&P Note (Signed)
History and Physical Interval Note:  04/19/2017 9:57 AM  Erica Frazier  has presented today for surgery, with the diagnosis of RIGHT BREAST CANCER  The various methods of treatment have been discussed with the patient and family. After consideration of risks, benefits and other options for treatment, the patient has consented to  Procedure(s): RIGHT BREAST LUMPECTOMY WITH RADIOACTIVE SEED AND RIGHT SENTINEL LYMPH NODE BIOPSY (Right) as a surgical intervention .  The patient's history has been reviewed, patient examined, no change in status, stable for surgery.  I have reviewed the patient's chart and labs.  Questions were answered to the patient's satisfaction.     Gyan Cambre A.

## 2017-04-19 NOTE — Anesthesia Preprocedure Evaluation (Signed)
Anesthesia Evaluation  Patient identified by MRN, date of birth, ID band Patient awake    Reviewed: Allergy & Precautions, NPO status , Patient's Chart, lab work & pertinent test results  Airway Mallampati: II  TM Distance: >3 FB Neck ROM: Full    Dental no notable dental hx.    Pulmonary neg pulmonary ROS,    Pulmonary exam normal breath sounds clear to auscultation       Cardiovascular negative cardio ROS Normal cardiovascular exam Rhythm:Regular Rate:Normal     Neuro/Psych negative neurological ROS  negative psych ROS   GI/Hepatic negative GI ROS, Neg liver ROS,   Endo/Other  negative endocrine ROSHypothyroidism   Renal/GU negative Renal ROS  negative genitourinary   Musculoskeletal negative musculoskeletal ROS (+)   Abdominal   Peds negative pediatric ROS (+)  Hematology negative hematology ROS (+)   Anesthesia Other Findings   Reproductive/Obstetrics negative OB ROS                             Anesthesia Physical Anesthesia Plan  ASA: II  Anesthesia Plan: General   Post-op Pain Management:    Induction: Intravenous  PONV Risk Score and Plan: 3 and Ondansetron, Dexamethasone, Midazolam and Treatment may vary due to age or medical condition  Airway Management Planned: LMA and Oral ETT  Additional Equipment:   Intra-op Plan:   Post-operative Plan:   Informed Consent:   Plan Discussed with: CRNA and Surgeon  Anesthesia Plan Comments: ( )        Anesthesia Quick Evaluation

## 2017-04-19 NOTE — Anesthesia Postprocedure Evaluation (Signed)
Anesthesia Post Note  Patient: Erica Frazier  Procedure(s) Performed: Procedure(s) (LRB): RIGHT BREAST LUMPECTOMY WITH RADIOACTIVE SEED AND RIGHT SENTINEL LYMPH NODE BIOPSY (Right)     Patient location during evaluation: PACU Anesthesia Type: General Level of consciousness: awake and alert Pain management: pain level controlled Vital Signs Assessment: post-procedure vital signs reviewed and stable Respiratory status: spontaneous breathing, nonlabored ventilation, respiratory function stable and patient connected to nasal cannula oxygen Cardiovascular status: blood pressure returned to baseline and stable Postop Assessment: no signs of nausea or vomiting Anesthetic complications: no    Last Vitals:  Vitals:   04/19/17 1245 04/19/17 1300  BP: (!) 108/55 (!) 99/55  Pulse: 83 78  Resp: 17 19  Temp:    SpO2: 98% 96%    Last Pain:  Vitals:   04/19/17 1315  TempSrc:   PainSc: 0-No pain                 Vinette Crites

## 2017-04-19 NOTE — Transfer of Care (Signed)
Immediate Anesthesia Transfer of Care Note  Patient: Erica Frazier  Procedure(s) Performed: Procedure(s): RIGHT BREAST LUMPECTOMY WITH RADIOACTIVE SEED AND RIGHT SENTINEL LYMPH NODE BIOPSY (Right)  Patient Location: PACU  Anesthesia Type:GA combined with regional for post-op pain  Level of Consciousness: sedated  Airway & Oxygen Therapy: Patient Spontanous Breathing and Patient connected to face mask oxygen  Post-op Assessment: Report given to RN and Post -op Vital signs reviewed and stable  Post vital signs: Reviewed and stable  Last Vitals:  Vitals:   04/19/17 1030 04/19/17 1045  BP: (!) 108/53 (!) 95/59  Pulse: (!) 57 (!) 53  Resp: 11 14  Temp:    SpO2: 100% 100%    Last Pain:  Vitals:   04/19/17 0924  TempSrc: Oral      Patients Stated Pain Goal: 0 (47/84/12 8208)  Complications: No apparent anesthesia complications

## 2017-04-20 ENCOUNTER — Encounter (HOSPITAL_BASED_OUTPATIENT_CLINIC_OR_DEPARTMENT_OTHER): Payer: Self-pay | Admitting: Surgery

## 2017-05-02 NOTE — Progress Notes (Signed)
Location of Breast Cancer:Right Breast Lower Outer Quadrant 8 o'clock position  Histology per Pathology Report: Diagnosis 03/30/2017: Breast, right, needle core biopsy, 8 o'clock - INVASIVE DUCTAL CARCINOMA.- DUCTAL CARCINOMA IN SITU.- LYMPHOVASCULAR INVASION IS IDENTIFIED  Receptor Status: ER(100%+), PR (100%+), Her2-neu (neg ratio=1.15), Ki-67(60%)  Did patient present with symptoms (if so, please note symptoms) or was this found on screening mammography?: Routine screening  Past/Anticipated interventions by surgeon, if OVF:IEPPIRJJO 04/19/17: Dr. Erroll Luna, MD 1. Breast, lumpectomy, Right - INVASIVE DUCTAL CARCINOMA, GRADE II/III, SPANNING 1.3 CM. - DUCTAL CARCINOMA IN SITU, INTERMEDIATE GRADE. - THE SURGICAL RESECTION MARGINS ARE NEGATIVE FOR CARCINOMA. - SEE ONCOLOGY TABLE BELOW. 2. Lymph node, sentinel, biopsy, Right axillary #1 - THERE IS NO EVIDENCE OF CARCINOMA IN 1 OF 1 LYMPH NODE (0/1). 3. Lymph node, sentinel, biopsy, Right axillary #2 - THERE IS NO EVIDENCE OF CARCINOMA IN 1 OF 1 LYMPH NODE (0/1).  Past/Anticipated interventions by medical oncology, if any: Chemotherapy   Lymphedema issues, if any: None  Pain issues, if any: Under her arm 3/10  SAFETY ISSUES:  Prior radiation? NO  Pacemaker/ICD? NO  Is the patient on methotrexate? No  Current Complaints / other details:  Divorced,, menarche age 19, G1P1,former cigarette smoker, occasional alcohol,   Vitals:   05/04/17 0839  BP: 126/72  Pulse: 82  Resp: 20  Temp: 98.1 F (36.7 C)  TempSrc: Oral  SpO2: 97%  Weight: 158 lb 6 oz (71.8 kg)   Wt Readings from Last 3 Encounters:  05/04/17 158 lb 6 oz (71.8 kg)  04/19/17 160 lb (72.6 kg)       Rebecca Eaton, RN 05/02/2017,10:34 AM

## 2017-05-04 ENCOUNTER — Encounter: Payer: Self-pay | Admitting: Radiation Oncology

## 2017-05-04 ENCOUNTER — Encounter: Payer: Self-pay | Admitting: *Deleted

## 2017-05-04 ENCOUNTER — Telehealth: Payer: Self-pay | Admitting: *Deleted

## 2017-05-04 ENCOUNTER — Ambulatory Visit
Admission: RE | Admit: 2017-05-04 | Discharge: 2017-05-04 | Disposition: A | Discharge status: Home / Self Care | Payer: Medicare Other | Source: Ambulatory Visit | Attending: Radiation Oncology | Admitting: Radiation Oncology

## 2017-05-04 ENCOUNTER — Ambulatory Visit (HOSPITAL_BASED_OUTPATIENT_CLINIC_OR_DEPARTMENT_OTHER): Payer: Medicare Other | Admitting: Hematology and Oncology

## 2017-05-04 VITALS — BP 126/72 | HR 82 | Temp 98.1°F | Resp 20 | Wt 158.4 lb

## 2017-05-04 DIAGNOSIS — C50511 Malignant neoplasm of lower-outer quadrant of right female breast: Secondary | ICD-10-CM | POA: Diagnosis not present

## 2017-05-04 DIAGNOSIS — E039 Hypothyroidism, unspecified: Secondary | ICD-10-CM | POA: Diagnosis not present

## 2017-05-04 DIAGNOSIS — C50411 Malignant neoplasm of upper-outer quadrant of right female breast: Secondary | ICD-10-CM | POA: Insufficient documentation

## 2017-05-04 DIAGNOSIS — M19049 Primary osteoarthritis, unspecified hand: Secondary | ICD-10-CM | POA: Insufficient documentation

## 2017-05-04 DIAGNOSIS — Z170 Estrogen receptor positive status [ER+]: Secondary | ICD-10-CM

## 2017-05-04 DIAGNOSIS — Z79890 Hormone replacement therapy (postmenopausal): Secondary | ICD-10-CM | POA: Insufficient documentation

## 2017-05-04 DIAGNOSIS — C50011 Malignant neoplasm of nipple and areola, right female breast: Secondary | ICD-10-CM

## 2017-05-04 DIAGNOSIS — Z510 Encounter for antineoplastic radiation therapy: Secondary | ICD-10-CM | POA: Insufficient documentation

## 2017-05-04 NOTE — Assessment & Plan Note (Signed)
04/19/2017: Right lumpectomy: IDC grade 2, 1.3 cm, intermediate grade DCIS, margins negative, 0/2 lymph nodes negative, ER 100%, PR 100%, HER-2 negative ratio 1.15, Ki-67 60%, T1 CN 0 stage IA  Pathology and radiology counseling:Discussed with the patient, the details of pathology including the type of breast cancer,the clinical staging, the significance of ER, PR and HER-2/neu receptors and the implications for treatment. After reviewing the pathology in detail, we proceeded to discuss the different treatment options between surgery, radiation, chemotherapy, antiestrogen therapies.  Recommendations: 1. Oncotype DX testing to determine if chemotherapy would be of any benefit followed by 2. Adjuvant radiation therapy followed by 3. Adjuvant antiestrogen therapy  Oncotype counseling: I discussed Oncotype DX test. I explained to the patient that this is a 21 gene panel to evaluate patient tumors DNA to calculate recurrence score. This would help determine whether patient has high risk or intermediate risk or low risk breast cancer. She understands that if her tumor was found to be high risk, she would benefit from systemic chemotherapy. If low risk, no need of chemotherapy. If she was found to be intermediate risk, we would need to evaluate the score as well as other risk factors and determine if an abbreviated chemotherapy may be of benefit.  Return to clinic based on Oncotype DX test result

## 2017-05-04 NOTE — Telephone Encounter (Signed)
Received order for oncotype testing. Requisition sent to pathology. Received by Lynelle Smoke

## 2017-05-04 NOTE — Progress Notes (Signed)
[facility] NOTE  Patient Care Team: [name], MD as PCP - General (Family Medicine)  CHIEF COMPLAINTS/PURPOSE OF CONSULTATION:  Newly diagnosed breast cancer  HISTORY OF PRESENTING ILLNESS:  Erica Frazier 76 y.o. female is here because of recent diagnosis of right breast cancer. Patient had a routine screening mammogram the detected abnormality in the right breast which was evaluated by ultrasound and biopsy. She underwent right lumpectomy on 04/19/2017 and that revealed invasive ductal carcinoma grade 2 and it measured 1.3 cm with intermediate grade DCIS. Margins were -0/2 lymph nodes were also negative. The tumor is ER/PR positive HER-2 negative with a Ki-67 60%. She was sent to [place] for discussion regarding adjuvant treatment options.  I reviewed her records extensively and collaborated the history with the patient.  SUMMARY OF ONCOLOGIC HISTORY:   Malignant neoplasm of upper-outer quadrant of right breast in female, estrogen receptor positive ([place])   04/19/2017 Surgery    Right lumpectomy: IDC grade 2, 1.3 cm, intermediate grade DCIS, margins negative, 0/2 lymph nodes negative, ER 100%, PR 100%, HER-2 negative ratio 1.15, Ki-67 60%, T1 CN 0 stage IA       MEDICAL HISTORY:  Past Medical History:  Diagnosis Date  . Arthritis    osteoarthritis hands  . Cancer ([place]) 03/2017   DCIS right breast  . Hypothyroidism     SURGICAL HISTORY: Past Surgical History:  Procedure Laterality Date  . ANKLE HARDWARE REMOVAL Left   . BREAST LUMPECTOMY WITH RADIOACTIVE SEED AND SENTINEL LYMPH NODE BIOPSY Right 04/19/2017   Procedure: RIGHT BREAST LUMPECTOMY WITH RADIOACTIVE SEED AND RIGHT SENTINEL LYMPH NODE BIOPSY;  Surgeon: Erroll Luna, MD;  Location: [facility];  Service: General;  Laterality: Right;  . ORIF ANKLE FRACTURE Left     SOCIAL HISTORY: Social History   Social History  . Marital status: Divorced    Spouse name: N/A  . Number  of children: N/A  . Years of education: N/A   Occupational History  . Not on file.   Social History Main Topics  . Smoking status: Never Smoker  . Smokeless tobacco: Never Used  . Alcohol use Yes     Comment: social  . Drug use: No  . Sexual activity: Not on file   Other Topics Concern  . Not on file   Social History Narrative  . No narrative on file    FAMILY HISTORY: No family history on file.  ALLERGIES:  has No Known Allergies.  MEDICATIONS:  Current Outpatient Prescriptions  Medication Sig Dispense Refill  . levothyroxine (SYNTHROID, LEVOTHROID) 125 MCG tablet Take 125 mcg by mouth daily before breakfast.    . Multiple Vitamin (MULTIVITAMIN WITH MINERALS) TABS tablet Take 1 tablet by mouth daily.     No current facility-administered medications for this visit.     REVIEW OF SYSTEMS:   Constitutional: Denies fevers, chills or abnormal night sweats Eyes: Denies blurriness of vision, double vision or watery eyes Ears, nose, mouth, throat, and face: Denies mucositis or sore throat Respiratory: Denies cough, dyspnea or wheezes Cardiovascular: Denies palpitation, chest discomfort or lower extremity swelling Gastrointestinal:  Denies nausea, heartburn or change in bowel habits Skin: Denies abnormal skin rashes Lymphatics: Denies new lymphadenopathy or easy bruising Neurological:Denies numbness, tingling or new weaknesses Behavioral/Psych: Mood is stable, no new changes  Breast:Right lumpectomy All other systems were reviewed with the patient and are negative.  PHYSICAL EXAMINATION: ECOG PERFORMANCE STATUS: 1 - Symptomatic but completely ambulatory  Vitals:  05/04/17 1028  BP: 120/65  Pulse: 66  Resp: 18  Temp: 97.6 F (36.4 C)  SpO2: 99%   Filed Weights   05/04/17 1028  Weight: 158 lb 4.8 oz (71.8 kg)    GENERAL:alert, no distress and comfortable SKIN: skin color, texture, turgor are normal, no rashes or significant lesions EYES: normal, conjunctiva  are pink and non-injected, sclera clear OROPHARYNX:no exudate, no erythema and lips, buccal mucosa, and tongue normal  NECK: supple, thyroid normal size, non-tender, without nodularity LYMPH:  no palpable lymphadenopathy in the cervical, axillary or inguinal LUNGS: clear to auscultation and percussion with normal breathing Frazier HEART: regular rate & rhythm and no murmurs and no lower extremity edema ABDOMEN:abdomen soft, non-tender and normal bowel sounds Musculoskeletal:no cyanosis of digits and no clubbing  PSYCH: alert & oriented x 3 with fluent speech NEURO: no focal motor/sensory deficits  RADIOGRAPHIC STUDIES: I have personally reviewed the radiological reports and agreed with the findings in the report.  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 After reviewing the pathology in detail, we proceeded to discuss the different treatment options between surgery, radiation, chemotherapy, antiestrogen therapies.  Recommendations: 1. Oncotype DX testing to determine if chemotherapy would be of any benefit followed by 2. Adjuvant radiation therapy followed by 3. Adjuvant antiestrogen therapy  Oncotype counseling: I discussed Oncotype DX test. I explained to the patient that this is a 21 gene panel to evaluate patient tumors DNA to calculate recurrence score. This would help determine whether  patient has high risk or intermediate risk or low risk breast cancer. She understands that if her tumor was found to be high risk, she would benefit from systemic chemotherapy. If low risk, no need of chemotherapy. If she was found to be intermediate risk, we would need to evaluate the score as well as other risk factors and determine if an abbreviated chemotherapy may be of benefit.  Return to clinic based on Oncotype DX test result   All questions were answered. The patient knows to call the clinic with any problems, questions or concerns.    Rulon Eisenmenger, MD 05/04/17

## 2017-05-04 NOTE — Progress Notes (Signed)
Radiation Oncology         [phone] ________________________________  Name: Erica Frazier        MRN: [id number]  Date of Service: 05/04/2017 DOB: 06/17/1941  VO:ZDGUY, Butch Penny, MD  Erroll Luna, MD     REFERRING PHYSICIAN: Erroll Luna, MD   DIAGNOSIS: The primary encounter diagnosis was Malignant neoplasm of lower-outer quadrant of right breast of female, estrogen receptor positive ([place]). A diagnosis of Malignant neoplasm of nipple of right breast in female, estrogen receptor positive ([place]) was also pertinent to this visit.   HISTORY OF PRESENT ILLNESS: Erica Frazier is a 76 y.o. female seen at the request of Dr. Brantley Stage for a recently diagnosed right breast cancer. The patient was seen after screening mammogram revealed a possible mass in the right breast on 03/23/17. She underwent diagnostic imaging including ultrasound which measured thei son 03/30/17 to be 1.5 x 1.3 x .9 cm at 8-9 o'clock, and her axilla did not reveal adenopathy. She underwent biopsy on 03/30/17 that revealed a grade 2, ER/PR positive, HER2 negative Ki67 60% invasive ductal carcinoma with LVSI. She subseuqently underwent lumpectomy and sentinel node evaluation on 04/19/17. Final pathology revealed a 1.3 cm grade 2 invasive ductal carcinoma with intermediate grade DCIS. Margins were negative, as were her 2 sampled nodes. She comes today to discuss options of therapy. She will be meeting with Dr. [name] today as well.   PREVIOUS RADIATION THERAPY: No   PAST MEDICAL HISTORY:  Past Medical History:  Diagnosis Date  . Arthritis    osteoarthritis hands  . Cancer ([place]) 03/2017   DCIS right breast  . Hypothyroidism        PAST SURGICAL HISTORY: Past Surgical History:  Procedure Laterality Date  . ANKLE HARDWARE REMOVAL Left   . BREAST LUMPECTOMY WITH RADIOACTIVE SEED AND SENTINEL LYMPH NODE BIOPSY Right 04/19/2017   Procedure: RIGHT BREAST LUMPECTOMY WITH RADIOACTIVE SEED AND RIGHT SENTINEL LYMPH NODE  BIOPSY;  Surgeon: Erroll Luna, MD;  Location: [facility];  Service: General;  Laterality: Right;  . ORIF ANKLE FRACTURE Left      FAMILY HISTORY: No family history on file.   SOCIAL HISTORY:  reports that she has never smoked. She has never used smokeless tobacco. She reports that she drinks alcohol. She reports that she does not use drugs. The patient is divorced and lives in [place]. 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  ALLERGIES: Patient has no known allergies.   MEDICATIONS:  Current Outpatient Prescriptions  Medication Sig Dispense Refill  . levothyroxine (SYNTHROID, LEVOTHROID) 125 MCG tablet Take 125 mcg by mouth daily before breakfast.    . Multiple Vitamin (MULTIVITAMIN WITH MINERALS) TABS tablet Take 1 tablet by mouth daily.     No current facility-administered medications for this encounter.      REVIEW OF SYSTEMS: On review of systems, the patient reports that she is doing well overall. She denies any chest pain, shortness of breath, cough, fevers, chills, night sweats, unintended weight changes. She denies any bowel or bladder disturbances, and denies abdominal pain, nausea or vomiting. She denies any new musculoskeletal or joint aches or pains. A complete review of systems is obtained and is otherwise negative.     PHYSICAL EXAM:  Wt Readings from Last 3 Encounters:  05/04/17 158 lb 6 oz (71.8 kg)  04/19/17 160 lb (72.6 kg)   Temp Readings from Last 3 Encounters:  05/04/17 98.1 F (36.7 C) (Oral)  04/19/17 97.6 F (36.4 C)   BP Readings from Last 3 Encounters:  05/04/17 126/72  04/19/17 (!) 104/57   Pulse Readings from Last 3 Encounters:  05/04/17 82  04/19/17 73   Pain Assessment Pain Score: 0-No pain/10  In general this is a well appearing Caucasian female in no acute distress. She is alert and oriented x4 and appropriate throughout the examination. HEENT reveals that the patient is  normocephalic, atraumatic. EOMs are intact. PERRLA. Skin is intact without any evidence of gross lesions. Cardiopulmonary assessment is negative for acute distress and she exhibits normal effort. The right breast/axilla is examined and reveals well healed axillary and breast incisions without induration or fullness. No erythema is noted.   ECOG = 0  0 - Asymptomatic (Fully active, able to carry on all predisease activities without restriction)  1 - Symptomatic but completely ambulatory (Restricted in physically strenuous activity but ambulatory and able to carry out work of a light or sedentary nature. For example, light housework, office work)  2 - Symptomatic, <50% in bed during the day (Ambulatory and capable of all self care but unable to carry out any work activities. Up and about more than 50% of waking hours)  3 - Symptomatic, >50% in bed, but not bedbound (Capable of only limited self-care, confined to bed or chair 50% or more of waking hours)  4 - Bedbound (Completely disabled. Cannot carry on any self-care. Totally confined to bed or chair)  5 - Death   Eustace Pen MM, Creech RH, Tormey DC, et al. [phone]). "Toxicity and response criteria of the [facility]". [place] Oncol. 5 (6): 649-55    LABORATORY DATA:  No results found for: WBC, HGB, HCT, MCV, PLT No results found for: NA, K, CL, CO2 No results found for: ALT, AST, GGT, ALKPHOS, BILITOT    RADIOGRAPHY: Mm Breast Surgical Specimen  Result Date: 04/19/2017 CLINICAL DATA:  Evaluate specimen EXAM: SPECIMEN RADIOGRAPH OF THE RIGHT BREAST COMPARISON:  Previous exam(s). FINDINGS: Status post excision of the right breast. The radioactive seed and biopsy marker clip are present, completely intact, and were marked for pathology. IMPRESSION: Specimen radiograph of the right breast. Electronically Signed   By: Dorise Bullion III M.D   On: 04/19/2017 11:57   Mm Rt Radioactive Seed Loc Mammo Guide  Result Date:  04/18/2017 CLINICAL DATA:  76 year old female presenting for radioactive seed localization of the right breast. EXAM: MAMMOGRAPHIC GUIDED RADIOACTIVE SEED LOCALIZATION OF THE RIGHT BREAST COMPARISON:  Previous exam(s). FINDINGS: Patient presents for radioactive seed localization prior to right breast lumpectomy. I met with the patient and we discussed the procedure of seed localization including benefits and alternatives. We discussed the high likelihood of a successful procedure. We discussed the risks of the procedure including infection, bleeding, tissue injury and further surgery. We discussed the low dose of radioactivity involved in the procedure. Informed, written consent was given. The usual time-out protocol was performed immediately prior to the procedure. Using mammographic guidance, sterile technique, 1% lidocaine and an I-125 radioactive seed, the mass just anterior to the ribbon shaped biopsy marking clip was localized using a lateral approach. The follow-up mammogram images confirm the seed in the expected location approximately 1 cm anterior to the ribbon shaped biopsy marking clip. Follow-up survey of the patient confirms presence of the radioactive seed. Order number of I-125 seed:  [id number]. Total activity:  5.053 millicuries  Reference Date: 03/30/2017 The patient tolerated the procedure well and [facility] She was given instructions regarding seed removal. IMPRESSION: Radioactive seed localization right breast. No apparent complications. Electronically Signed   By: Ammie Ferrier M.D.   On: 04/18/2017 15:07       IMPRESSION/PLAN: 1. Stage IA, [id number] grade 2, ER/PR positive invasive ductal carcinoma with intermediate grade DCIS of the right breast. Dr. Lisbeth Renshaw discusses the pathology findings and reviews the nature of invasive breast disease. She is planning to meet with Dr. [name] today and will discuss options for oncotype testing and antiestrogen therapy. We  reviewed the rationale and utility for radiotherapy in the adjuvant setting at the appropriate interval, either to follow surgery or chemotherapy if needed.  We discussed the risks, benefits, short, and long term effects of radiotherapy, and the patient is interested in proceeding. Dr. Lisbeth Renshaw discusses the delivery and logistics of radiotherapy, and would anticipate a course of 4 weeks of radiotherapy. We will plan to follow up with the discussion she has with Dr. [name] and await oncotype results if ordered prior to scheduling simulation.   The above documentation reflects my direct findings during this shared patient visit. Please see the separate note by Dr. Lisbeth Renshaw on this date for the remainder of the patient's plan of care.    [name], PAC

## 2017-05-08 ENCOUNTER — Encounter: Payer: Self-pay | Admitting: Hematology and Oncology

## 2017-05-08 NOTE — Progress Notes (Signed)
Medical records requested from [facility] 05/05/17, faxed to [phone] on 05/08/17, confirmation received

## 2017-05-17 ENCOUNTER — Encounter: Payer: Self-pay | Admitting: Hematology and Oncology

## 2017-05-17 ENCOUNTER — Telehealth: Payer: Self-pay | Admitting: *Deleted

## 2017-05-17 NOTE — Telephone Encounter (Signed)
Received oncotype results of 26.  Patient aware. She will see Dr. [name] 10/1  At 11am to discuss

## 2017-05-19 ENCOUNTER — Encounter (HOSPITAL_COMMUNITY): Payer: Self-pay

## 2017-05-22 ENCOUNTER — Ambulatory Visit (HOSPITAL_BASED_OUTPATIENT_CLINIC_OR_DEPARTMENT_OTHER): Payer: Medicare Other | Admitting: Hematology and Oncology

## 2017-05-22 DIAGNOSIS — Z170 Estrogen receptor positive status [ER+]: Secondary | ICD-10-CM | POA: Diagnosis not present

## 2017-05-22 DIAGNOSIS — C50411 Malignant neoplasm of upper-outer quadrant of right female breast: Secondary | ICD-10-CM | POA: Diagnosis not present

## 2017-05-22 NOTE — Progress Notes (Signed)
Patient Care Team: [name], MD as PCP - General (Family Medicine)  DIAGNOSIS:  Encounter Diagnosis  Name Primary?  . Malignant neoplasm of upper-outer quadrant of right breast in female, estrogen receptor positive ([place])     SUMMARY OF ONCOLOGIC HISTORY:   Malignant neoplasm of upper-outer quadrant of right breast in female, estrogen receptor positive ([place])   04/19/2017 Surgery    Right lumpectomy: IDC grade 2, 1.3 cm, intermediate grade DCIS, margins negative, 0/2 lymph nodes negative, ER 100%, PR 100%, HER-2 negative ratio 1.15, Ki-67 60%, T1 CN 0 stage IA      04/19/2017 Oncotype testing    Oncotype score 26: Risk of distant recurrence 17% with hormonal therapy alone; patient did not want to undergo chemotherapy for a 4% benefit       CHIEF COMPLIANT: Follow-up after Oncotype DX testing  INTERVAL HISTORY: Erica Frazier is a 76 year old with above-mentioned history of right breast cancer treated with lumpectomy and is here today to discuss the results of Oncotype DX test result. She has completely healed and recovered from the prior surgery.  REVIEW OF SYSTEMS:   Constitutional: Denies fevers, chills or abnormal weight loss Eyes: Denies blurriness of vision Ears, nose, mouth, throat, and face: Denies mucositis or sore throat Respiratory: Denies cough, dyspnea or wheezes Cardiovascular: Denies palpitation, chest discomfort Gastrointestinal:  Denies nausea, heartburn or change in bowel habits Skin: Denies abnormal skin rashes Lymphatics: Denies new lymphadenopathy or easy bruising Neurological:Denies numbness, tingling or new weaknesses Behavioral/Psych: Mood is stable, no new changes    All other systems were reviewed with the patient and are negative.  I have reviewed the past medical history, past surgical history, social history and family history with the patient and they are unchanged from previous note.  ALLERGIES:  has No Known Allergies.  MEDICATIONS:    Current Outpatient Prescriptions  Medication Sig Dispense Refill  . levothyroxine (SYNTHROID, LEVOTHROID) 125 MCG tablet Take 125 mcg by mouth daily before breakfast.    . Multiple Vitamin (MULTIVITAMIN WITH MINERALS) TABS tablet Take 1 tablet by mouth daily.     No current facility-administered medications for this visit.     PHYSICAL EXAMINATION: ECOG PERFORMANCE STATUS: 1 - Symptomatic but completely ambulatory  Vitals:   05/22/17 1121  BP: 128/64  Pulse: 73  Resp: 18  Temp: 97.8 F (36.6 C)  SpO2: 97%   Filed Weights   05/22/17 1121  Weight: 160 lb 6.4 oz (72.8 kg)    GENERAL:alert, no distress and comfortable SKIN: skin color, texture, turgor are normal, no rashes or significant lesions EYES: normal, Conjunctiva are pink and non-injected, sclera clear OROPHARYNX:no exudate, no erythema and lips, buccal mucosa, and tongue normal  NECK: supple, thyroid normal size, non-tender, without nodularity LYMPH:  no palpable lymphadenopathy in the cervical, axillary or inguinal LUNGS: clear to auscultation and percussion with normal breathing effort HEART: regular rate & rhythm and no murmurs and no lower extremity edema ABDOMEN:abdomen soft, non-tender and normal bowel sounds MUSCULOSKELETAL:no cyanosis of digits and no clubbing  NEURO: alert & oriented x 3 with fluent speech, no focal motor/sensory deficits EXTREMITIES: No lower extremity edema  LABORATORY DATA:  I have reviewed the data as listed   Chemistry   No results found for: NA, K, CL, CO2, BUN, CREATININE, GLU No results found for: CALCIUM, ALKPHOS, AST, ALT, BILITOT     No results found for: WBC, HGB, HCT, MCV, PLT, NEUTROABS  ASSESSMENT & PLAN:  Malignant neoplasm of upper-outer quadrant of  right breast in female, estrogen receptor positive ([place]) 04/19/2017: Right lumpectomy: IDC grade 2, 1.3 cm, intermediate grade DCIS, margins negative, 0/2 lymph nodes negative, ER 100%, PR 100%, HER-2 negative ratio 1.15,  Ki-67 60%, T1 CN 0 stage IA  Oncotype DX score 26: Distant recurrence rate 17% with hormonal therapy alone. We discussed extensively with the risks and benefits of chemotherapy including the treatment with Taxotere and Cytoxan every 3 weeks 4 cycles. However after lengthy discussion she decided that she does not want to undergo systemic chemotherapy for a 4% benefit. 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  Recommendations: 1. Adjuvant radiation therapy followed by 2. Adjuvant antiestrogen therapy  Return to clinic at the end of radiation to start antiestrogen therapy.  I spent 25 minutes talking to the patient of which more than half was spent in counseling and coordination of care.  No orders of the defined types were placed in this encounter.  The patient has a good understanding of the overall plan. she agrees with it. she will call with any problems that may develop before the next visit here.   Rulon Eisenmenger, MD 05/22/17

## 2017-05-22 NOTE — Assessment & Plan Note (Signed)
04/19/2017: Right lumpectomy: IDC grade 2, 1.3 cm, intermediate grade DCIS, margins negative, 0/2 lymph nodes negative, ER 100%, PR 100%, HER-2 negative ratio 1.15, Ki-67 60%, T1 CN 0 stage IA  Oncotype DX score 26: Distant recurrence rate 17% with hormonal therapy alone. We discussed extensively with the risks and benefits of chemotherapy including the treatment with Taxotere and Cytoxan every 3 weeks 4 cycles. However after lengthy discussion she decided that she does not want to undergo systemic chemotherapy for a 4% benefit.  Recommendations: 1. Adjuvant radiation therapy followed by 2. Adjuvant antiestrogen therapy  Return to clinic at the end of radiation to start antiestrogen therapy.

## 2017-05-25 ENCOUNTER — Ambulatory Visit
Admission: RE | Admit: 2017-05-25 | Discharge: 2017-05-25 | Disposition: A | Discharge status: Home / Self Care | Payer: Medicare Other | Source: Ambulatory Visit | Attending: Radiation Oncology | Admitting: Radiation Oncology

## 2017-05-25 ENCOUNTER — Encounter: Payer: Self-pay | Admitting: Radiation Oncology

## 2017-05-25 VITALS — BP 125/68 | HR 78 | Temp 97.7°F | Resp 20 | Ht 66.0 in | Wt 160.6 lb

## 2017-05-25 DIAGNOSIS — C50411 Malignant neoplasm of upper-outer quadrant of right female breast: Secondary | ICD-10-CM

## 2017-05-25 DIAGNOSIS — Z170 Estrogen receptor positive status [ER+]: Principal | ICD-10-CM

## 2017-05-25 NOTE — Progress Notes (Signed)
Please see the Nurse Progress Note in the MD Initial Consult Encounter for this patient. 

## 2017-05-25 NOTE — Progress Notes (Signed)
Location of Breast Cancer:Right Breast Lower Outer Quadrant 8 o'clock position  Histology per Pathology Report: Diagnosis 03/30/2017: Breast, right, needle core biopsy, 8 o'clock - INVASIVE DUCTAL CARCINOMA.- DUCTAL CARCINOMA IN SITU.- LYMPHOVASCULAR INVASION IS IDENTIFIED  Receptor Status: ER(100%+), PR (100%+), Her2-neu (neg ratio=1.15), Ki-67(60%)  Did patient present with symptoms (if so, please note symptoms) or was this found on screening mammography?: Routine screening  Past/Anticipated interventions by surgeon, if [facility]:[id number] 04/19/17: Dr. Erroll Luna, MD 1. Breast, lumpectomy, Right - INVASIVE DUCTAL CARCINOMA, GRADE II/III, SPANNING 1.3 CM. - DUCTAL CARCINOMA IN SITU, INTERMEDIATE GRADE. - THE SURGICAL RESECTION MARGINS ARE NEGATIVE FOR CARCINOMA. - SEE ONCOLOGY TABLE BELOW. 2. Lymph node, sentinel, biopsy, Right axillary #1 - THERE IS NO EVIDENCE OF CARCINOMA IN 1 OF 1 LYMPH NODE (0/1). 3. Lymph node, sentinel, biopsy, Right axillary #2 - THERE IS NO EVIDENCE OF CARCINOMA IN 1 OF 1 LYMPH NODE (0/1).  Past/Anticipated interventions by medical oncology, if any: Chemotherapy   Lymphedema issues, if any: None  Pain issues, if any: breast states aggravating at times  SAFETY ISSUES:  Prior radiation? NO  Pacemaker/ICD? NO  Is the patient on methotrexate? No  Current Complaints / other details:  Divorced,, menarche age 47, G1P1,former cigarette smoker, occasional alcohol,   Vitals:   05/25/17 1331  BP: 125/68  Pulse: 78  Resp: 20  Temp: 97.7 F (36.5 C)  TempSrc: Oral  SpO2: 99%  Weight: 160 lb 9.6 oz (72.8 kg)  Height: _0  (1.676 m)   Wt Readings from Last 3 Encounters:  05/25/17 160 lb 9.6 oz (72.8 kg)  05/22/17 160 lb 6.4 oz (72.8 kg)  05/04/17 158 lb 6 oz (71.8 kg)       Rebecca Eaton, RN 05/25/2017,1:36 PM

## 2017-05-25 NOTE — Progress Notes (Signed)
Radiation Oncology         (336) [phone] ________________________________  Name: Erica Frazier        MRN: [id number]  Date of Service: 05/25/2017 DOB: 30-Dec-1940  CN:[id number], Butch Penny, MD  [name], MD     REFERRING PHYSICIAN: [name], MD   DIAGNOSIS: The encounter diagnosis was Malignant neoplasm of upper-outer quadrant of right breast in female, estrogen receptor positive ([place]).   HISTORY OF PRESENT ILLNESS: Erica Frazier is a 76 y.o. female seen at the request of Dr. Brantley Stage for a recently diagnosed right breast cancer. The patient was seen after screening mammogram revealed a possible mass in the right breast on 03/23/17. She underwent diagnostic imaging including ultrasound which measured thei son 03/30/17 to be 1.5 x 1.3 x .9 cm at 8-9 o'clock, and her axilla did not reveal adenopathy. She underwent biopsy on 03/30/17 that revealed a grade 2, ER/PR positive, HER2 negative Ki67 60% invasive ductal carcinoma with LVSI. She subseuqently underwent lumpectomy and sentinel node evaluation on 04/19/17. Final pathology revealed a 1.3 cm grade 2 invasive ductal carcinoma with intermediate grade DCIS. Margins were negative, as were her 2 sampled nodes. She had an oncotype score of 26 is not anticipating chemotherapy. She comes to review options of radiotherapy.  PREVIOUS RADIATION THERAPY: No   PAST MEDICAL HISTORY:  Past Medical History:  Diagnosis Date  . Arthritis    osteoarthritis hands  . Cancer ([place]) 03/2017   DCIS right breast  . Hypothyroidism        PAST SURGICAL HISTORY: Past Surgical History:  Procedure Laterality Date  . ANKLE HARDWARE REMOVAL Left   . BREAST LUMPECTOMY WITH RADIOACTIVE SEED AND SENTINEL LYMPH NODE BIOPSY Right 04/19/2017   Procedure: RIGHT BREAST LUMPECTOMY WITH RADIOACTIVE SEED AND RIGHT SENTINEL LYMPH NODE BIOPSY;  Surgeon: Erroll Luna, MD;  Location: [facility];  Service: General;  Laterality: Right;  . ORIF ANKLE FRACTURE  Left      FAMILY HISTORY: No family history on file.   SOCIAL HISTORY:  reports that she has never smoked. She has never used smokeless tobacco. She reports that she drinks alcohol. She reports that she does not use drugs.The patient is divorced and lives in [place]. 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  ALLERGIES: Patient has no known allergies.   MEDICATIONS:  Current Outpatient Prescriptions  Medication Sig Dispense Refill  . levothyroxine (SYNTHROID, LEVOTHROID) 125 MCG tablet Take 125 mcg by mouth daily before breakfast.    . Multiple Vitamin (MULTIVITAMIN WITH MINERALS) TABS tablet Take 1 tablet by mouth daily.    . naproxen sodium (ANAPROX) 220 MG tablet Take 220 mg by mouth as needed.     No current facility-administered medications for this encounter.      REVIEW OF SYSTEMS: On review of systems, the patient reports that she is doing well overall. She denies any chest pain, shortness of breath, cough, fevers, chills, night sweats, unintended weight changes. She denies any bowel or bladder disturbances, and denies abdominal pain, nausea or vomiting. She denies any new musculoskeletal or joint aches or pains. A complete review of systems is obtained and is otherwise negative.    PHYSICAL EXAM:  Wt Readings from Last 3 Encounters:  05/25/17 160 lb 9.6 oz (72.8 kg)  05/22/17 160 lb 6.4 oz (72.8 kg)  05/04/17 158 lb 6 oz (71.8 kg)   Temp Readings from Last 3 Encounters:  05/25/17 97.7 F (36.5 C) (  Oral)  05/22/17 97.8 F (36.6 C) (Oral)  05/04/17 98.1 F (36.7 C) (Oral)   BP Readings from Last 3 Encounters:  05/25/17 125/68  05/22/17 128/64  05/04/17 126/72   Pulse Readings from Last 3 Encounters:  05/25/17 78  05/22/17 73  05/04/17 82   Pain Assessment Pain Score: 2  Pain Loc: Breast (right )/10  In general this is a well appearing Caucasian female in no acute distress. She is alert and oriented x4 and appropriate  throughout the examination. HEENT reveals that the patient is normocephalic, atraumatic. EOMs are intact. PERRLA. Skin is intact without any evidence of gross lesions. Cardiopulmonary assessment is negative for acute distress and she exhibits normal effort. The right breast/axilla is again examined and reveals well healed axillary and breast incisions without induration or fullness. No erythema is noted.   ECOG = 0  0 - Asymptomatic (Fully active, able to carry on all predisease activities without restriction)  1 - Symptomatic but completely ambulatory (Restricted in physically strenuous activity but ambulatory and able to carry out work of a light or sedentary nature. For example, light housework, office work)  2 - Symptomatic, <50% in bed during the day (Ambulatory and capable of all self care but unable to carry out any work activities. Up and about more than 50% of waking hours)  3 - Symptomatic, >50% in bed, but not bedbound (Capable of only limited self-care, confined to bed or chair 50% or more of waking hours)  4 - Bedbound (Completely disabled. Cannot carry on any self-care. Totally confined to bed or chair)  5 - Death   Eustace Pen MM, Creech RH, Tormey DC, et al. [phone]). "Toxicity and response criteria of the [facility]". [place] Oncol. 5 (6): 649-55    LABORATORY DATA:  No results found for: WBC, HGB, HCT, MCV, PLT No results found for: NA, K, CL, CO2 No results found for: ALT, AST, GGT, ALKPHOS, BILITOT    RADIOGRAPHY: No results found.     IMPRESSION/PLAN: 1. Stage IA, [id number] grade 2, ER/PR positive invasive ductal carcinoma with intermediate grade DCIS of the right breast. Dr. Lisbeth Renshaw reviews the options of radiotherapy and the logistics of treatment. We discussed the risks, benefits, short, and long term effects of radiotherapy, and the patient is interested in proceeding. Dr. Lisbeth Renshaw discusses the delivery and logistics of radiotherapy, and would  anticipate a course of 4 weeks of radiotherapy. Written consent is obtained and placed in the chart, a copy was provided to the patient. She will return on Tuesday next week for simulation. We also discussed a BID treatment the 3rd week of her course to account for her being out of town the Fridays for her work.  In a visit lasting 25 minutes, greater than 50% of the time was spent face to face discussing the logistics of her treatment schedule, and coordinating the patient's care.   The above documentation reflects my direct findings during this shared patient visit. Please see the separate note by Dr. Lisbeth Renshaw on this date for the remainder of the patient's plan of care.    [name], PAC

## 2017-05-30 ENCOUNTER — Ambulatory Visit
Admission: RE | Admit: 2017-05-30 | Discharge: 2017-05-30 | Disposition: A | Discharge status: Home / Self Care | Payer: Medicare Other | Source: Ambulatory Visit | Attending: Radiation Oncology | Admitting: Radiation Oncology

## 2017-05-30 DIAGNOSIS — C50411 Malignant neoplasm of upper-outer quadrant of right female breast: Secondary | ICD-10-CM

## 2017-05-30 DIAGNOSIS — Z170 Estrogen receptor positive status [ER+]: Principal | ICD-10-CM

## 2017-05-30 DIAGNOSIS — Z510 Encounter for antineoplastic radiation therapy: Secondary | ICD-10-CM | POA: Diagnosis not present

## 2017-05-30 NOTE — Progress Notes (Signed)
  Radiation Oncology         (336) [phone] ________________________________  Name: Erica Frazier MRN: [id number]  Date: 05/30/2017  DOB: Apr 08, 1941  Optical Surface Tracking Plan:  Since intensity modulated radiotherapy (IMRT) and 3D conformal radiation treatment methods are predicated on accurate and precise positioning for treatment, intrafraction motion monitoring is medically necessary to ensure accurate and safe treatment delivery.  The ability to quantify intrafraction motion without excessive ionizing radiation dose can only be performed with optical surface tracking. Accordingly, surface imaging offers the opportunity to obtain 3D measurements of patient position throughout IMRT and 3D treatments without excessive radiation exposure.  I am ordering optical surface tracking for this patient's upcoming course of radiotherapy. ________________________________  Kyung Rudd, MD 05/30/2017 3:43 PM    Reference:   Ursula Alert, J, et al. Surface imaging-based analysis of intrafraction motion for breast radiotherapy patients.Journal of [place], n. 6, nov. 2014. ISSN 32202542.   Available at: <http://www.jacmp.org/index.php/jacmp/article/view/4957>.

## 2017-05-30 NOTE — Progress Notes (Signed)
  Radiation Oncology         (336) [phone] ________________________________  Name: ALVERDA NAZZARO MRN: [id number]  Date: 05/30/2017  DOB: 03-23-1941   DIAGNOSIS:     ICD-10-CM   1. Malignant neoplasm of upper-outer quadrant of right breast in female, estrogen receptor positive ([place]) C50.411    Z17.0     SIMULATION AND TREATMENT PLANNING NOTE  The patient presented for simulation prior to beginning her course of radiation treatment for her diagnosis of right-sided breast cancer. The patient was placed in a supine position on a breast board. A customized vac-lock bag was constructed and this complex treatment device will be used on a daily basis during her treatment. In this fashion, a CT scan was obtained through the chest area and an isocenter was placed near the chest wall within the breast.  The patient will be planned to receive a course of radiation initially to a dose of 42.5 Gy. This will consist of a whole breast radiotherapy technique. To accomplish this, 2 customized blocks have been designed which will correspond to medial and lateral whole breast tangent fields. This treatment will be accomplished at 2.5 Gy per fraction. A forward planning technique will also be evaluated to determine if this approach improves the plan. It is anticipated that the patient will then receive a 7.5 Gy boost to the seroma cavity which has been contoured. This will be accomplished at 2.5 Gy per fraction.   This initial treatment will consist of a 3-D conformal technique. The seroma has been contoured as the primary target structure. Additionally, dose volume histograms of both this target as well as the lungs and heart will also be evaluated. Such an approach is necessary to ensure that the target area is adequately covered while the nearby critical  normal structures are adequately spared.  Plan:  The final anticipated total dose therefore will correspond to 50  Gy.    _______________________________   Jodelle Gross, MD, PhD

## 2017-06-02 DIAGNOSIS — Z510 Encounter for antineoplastic radiation therapy: Secondary | ICD-10-CM | POA: Diagnosis not present

## 2017-06-05 ENCOUNTER — Ambulatory Visit
Admission: RE | Admit: 2017-06-05 | Discharge: 2017-06-05 | Disposition: A | Discharge status: Home / Self Care | Payer: Medicare Other | Source: Ambulatory Visit | Attending: Radiation Oncology | Admitting: Radiation Oncology

## 2017-06-05 DIAGNOSIS — Z510 Encounter for antineoplastic radiation therapy: Secondary | ICD-10-CM | POA: Diagnosis not present

## 2017-06-05 DIAGNOSIS — Z170 Estrogen receptor positive status [ER+]: Principal | ICD-10-CM

## 2017-06-05 DIAGNOSIS — C50411 Malignant neoplasm of upper-outer quadrant of right female breast: Secondary | ICD-10-CM

## 2017-06-05 MED ORDER — RADIAPLEXRX EX GEL
Freq: Once | CUTANEOUS | Status: AC
  Administered 2017-06-05: 13:00:00 via TOPICAL

## 2017-06-05 MED ORDER — ALRA NON-METALLIC DEODORANT (RAD-ONC)
1.0000 "application " | Freq: Once | TOPICAL | Status: AC
  Administered 2017-06-05: 1 via TOPICAL

## 2017-06-05 NOTE — Progress Notes (Signed)
Pt here for patient teaching.  Pt given Radiation and You booklet, skin care instructions, Alra deodorant and Radiaplex gel. Pt reports they have not watched, not watched link given to watch the Radiation Therapy Education video on April 18, 2017.  Reviewed areas of pertinence such as fatigue, hair loss, skin changes, breast tenderness, breast swelling.  Pt able to give teach back of to pat skin, use unscented/gentle soap, and drink plenty of water,apply Radiaplex bid, avoid applying anything to skin within 4 hours of treatment, avoid wearing an under wire bra and to use an electric razor if they must shave. Pt demonstrated understanding of information given and will contact nursing with any questions or concerns.  No concerns for any of the pertinent areas.     [email or web address]

## 2017-06-06 ENCOUNTER — Ambulatory Visit
Admission: RE | Admit: 2017-06-06 | Discharge: 2017-06-06 | Disposition: A | Discharge status: Home / Self Care | Payer: Medicare Other | Source: Ambulatory Visit | Attending: Radiation Oncology | Admitting: Radiation Oncology

## 2017-06-06 DIAGNOSIS — Z510 Encounter for antineoplastic radiation therapy: Secondary | ICD-10-CM | POA: Diagnosis not present

## 2017-06-07 ENCOUNTER — Ambulatory Visit
Admission: RE | Admit: 2017-06-07 | Discharge: 2017-06-07 | Disposition: A | Discharge status: Home / Self Care | Payer: Medicare Other | Source: Ambulatory Visit | Attending: Radiation Oncology | Admitting: Radiation Oncology

## 2017-06-07 DIAGNOSIS — Z510 Encounter for antineoplastic radiation therapy: Secondary | ICD-10-CM | POA: Diagnosis not present

## 2017-06-08 ENCOUNTER — Ambulatory Visit
Admission: RE | Admit: 2017-06-08 | Discharge: 2017-06-08 | Disposition: A | Discharge status: Home / Self Care | Payer: Medicare Other | Source: Ambulatory Visit | Attending: Radiation Oncology | Admitting: Radiation Oncology

## 2017-06-08 DIAGNOSIS — Z510 Encounter for antineoplastic radiation therapy: Secondary | ICD-10-CM | POA: Diagnosis not present

## 2017-06-09 ENCOUNTER — Ambulatory Visit: Payer: Medicare Other

## 2017-06-12 ENCOUNTER — Ambulatory Visit
Admission: RE | Admit: 2017-06-12 | Discharge: 2017-06-12 | Disposition: A | Discharge status: Home / Self Care | Payer: Medicare Other | Source: Ambulatory Visit | Attending: Radiation Oncology | Admitting: Radiation Oncology

## 2017-06-12 DIAGNOSIS — Z510 Encounter for antineoplastic radiation therapy: Secondary | ICD-10-CM | POA: Diagnosis not present

## 2017-06-13 ENCOUNTER — Ambulatory Visit
Admission: RE | Admit: 2017-06-13 | Discharge: 2017-06-13 | Disposition: A | Discharge status: Home / Self Care | Payer: Medicare Other | Source: Ambulatory Visit | Attending: Radiation Oncology | Admitting: Radiation Oncology

## 2017-06-13 DIAGNOSIS — Z510 Encounter for antineoplastic radiation therapy: Secondary | ICD-10-CM | POA: Diagnosis not present

## 2017-06-14 ENCOUNTER — Ambulatory Visit
Admission: RE | Admit: 2017-06-14 | Discharge: 2017-06-14 | Disposition: A | Discharge status: Home / Self Care | Payer: Medicare Other | Source: Ambulatory Visit | Attending: Radiation Oncology | Admitting: Radiation Oncology

## 2017-06-14 DIAGNOSIS — Z510 Encounter for antineoplastic radiation therapy: Secondary | ICD-10-CM | POA: Diagnosis not present

## 2017-06-15 ENCOUNTER — Ambulatory Visit
Admission: RE | Admit: 2017-06-15 | Discharge: 2017-06-15 | Disposition: A | Discharge status: Home / Self Care | Payer: Medicare Other | Source: Ambulatory Visit | Attending: Radiation Oncology | Admitting: Radiation Oncology

## 2017-06-15 DIAGNOSIS — Z510 Encounter for antineoplastic radiation therapy: Secondary | ICD-10-CM | POA: Diagnosis not present

## 2017-06-16 ENCOUNTER — Ambulatory Visit: Payer: Medicare Other

## 2017-06-16 ENCOUNTER — Ambulatory Visit
Admission: RE | Admit: 2017-06-16 | Discharge: 2017-06-16 | Disposition: A | Discharge status: Home / Self Care | Payer: Medicare Other | Source: Ambulatory Visit | Attending: Radiation Oncology | Admitting: Radiation Oncology

## 2017-06-16 DIAGNOSIS — Z510 Encounter for antineoplastic radiation therapy: Secondary | ICD-10-CM | POA: Diagnosis not present

## 2017-06-19 ENCOUNTER — Ambulatory Visit: Payer: Medicare Other

## 2017-06-19 ENCOUNTER — Ambulatory Visit
Admission: RE | Admit: 2017-06-19 | Discharge: 2017-06-19 | Disposition: A | Discharge status: Home / Self Care | Payer: Medicare Other | Source: Ambulatory Visit | Attending: Radiation Oncology | Admitting: Radiation Oncology

## 2017-06-19 DIAGNOSIS — Z510 Encounter for antineoplastic radiation therapy: Secondary | ICD-10-CM | POA: Diagnosis not present

## 2017-06-20 ENCOUNTER — Ambulatory Visit
Admission: RE | Admit: 2017-06-20 | Discharge: 2017-06-20 | Disposition: A | Discharge status: Home / Self Care | Payer: Medicare Other | Source: Ambulatory Visit | Attending: Radiation Oncology | Admitting: Radiation Oncology

## 2017-06-20 ENCOUNTER — Ambulatory Visit: Payer: Medicare Other

## 2017-06-20 DIAGNOSIS — Z510 Encounter for antineoplastic radiation therapy: Secondary | ICD-10-CM | POA: Diagnosis not present

## 2017-06-21 ENCOUNTER — Ambulatory Visit: Payer: Medicare Other

## 2017-06-21 ENCOUNTER — Ambulatory Visit
Admission: RE | Admit: 2017-06-21 | Discharge: 2017-06-21 | Disposition: A | Discharge status: Home / Self Care | Payer: Medicare Other | Source: Ambulatory Visit | Attending: Radiation Oncology | Admitting: Radiation Oncology

## 2017-06-21 DIAGNOSIS — Z510 Encounter for antineoplastic radiation therapy: Secondary | ICD-10-CM | POA: Diagnosis not present

## 2017-06-22 ENCOUNTER — Ambulatory Visit: Payer: Medicare Other

## 2017-06-22 ENCOUNTER — Ambulatory Visit
Admission: RE | Admit: 2017-06-22 | Discharge: 2017-06-22 | Disposition: A | Discharge status: Home / Self Care | Payer: Medicare Other | Source: Ambulatory Visit | Attending: Radiation Oncology | Admitting: Radiation Oncology

## 2017-06-22 ENCOUNTER — Ambulatory Visit: Payer: Medicare Other | Admitting: Radiation Oncology

## 2017-06-22 DIAGNOSIS — Z510 Encounter for antineoplastic radiation therapy: Secondary | ICD-10-CM | POA: Diagnosis not present

## 2017-06-23 ENCOUNTER — Ambulatory Visit: Payer: Medicare Other

## 2017-06-26 ENCOUNTER — Ambulatory Visit: Payer: Medicare Other

## 2017-06-26 ENCOUNTER — Ambulatory Visit
Admission: RE | Admit: 2017-06-26 | Discharge: 2017-06-26 | Disposition: A | Discharge status: Home / Self Care | Payer: Medicare Other | Source: Ambulatory Visit | Attending: Radiation Oncology | Admitting: Radiation Oncology

## 2017-06-26 DIAGNOSIS — Z510 Encounter for antineoplastic radiation therapy: Secondary | ICD-10-CM | POA: Diagnosis not present

## 2017-06-27 ENCOUNTER — Ambulatory Visit
Admission: RE | Admit: 2017-06-27 | Discharge: 2017-06-27 | Disposition: A | Discharge status: Home / Self Care | Payer: Medicare Other | Source: Ambulatory Visit | Attending: Radiation Oncology | Admitting: Radiation Oncology

## 2017-06-27 ENCOUNTER — Ambulatory Visit: Payer: Medicare Other

## 2017-06-27 DIAGNOSIS — Z510 Encounter for antineoplastic radiation therapy: Secondary | ICD-10-CM | POA: Diagnosis not present

## 2017-06-28 ENCOUNTER — Ambulatory Visit: Payer: Medicare Other

## 2017-06-28 ENCOUNTER — Ambulatory Visit
Admission: RE | Admit: 2017-06-28 | Discharge: 2017-06-28 | Disposition: A | Discharge status: Home / Self Care | Payer: Medicare Other | Source: Ambulatory Visit | Attending: Radiation Oncology | Admitting: Radiation Oncology

## 2017-06-28 DIAGNOSIS — Z510 Encounter for antineoplastic radiation therapy: Secondary | ICD-10-CM | POA: Diagnosis not present

## 2017-06-29 ENCOUNTER — Ambulatory Visit: Payer: Medicare Other

## 2017-06-29 ENCOUNTER — Ambulatory Visit
Admission: RE | Admit: 2017-06-29 | Discharge: 2017-06-29 | Disposition: A | Discharge status: Home / Self Care | Payer: Medicare Other | Source: Ambulatory Visit | Attending: Radiation Oncology | Admitting: Radiation Oncology

## 2017-06-29 DIAGNOSIS — Z510 Encounter for antineoplastic radiation therapy: Secondary | ICD-10-CM | POA: Diagnosis not present

## 2017-06-30 ENCOUNTER — Ambulatory Visit: Payer: Medicare Other

## 2017-07-03 ENCOUNTER — Ambulatory Visit: Payer: Medicare Other

## 2017-07-03 ENCOUNTER — Ambulatory Visit
Admission: RE | Admit: 2017-07-03 | Discharge: 2017-07-03 | Disposition: A | Discharge status: Home / Self Care | Payer: Medicare Other | Source: Ambulatory Visit | Attending: Radiation Oncology | Admitting: Radiation Oncology

## 2017-07-03 DIAGNOSIS — Z510 Encounter for antineoplastic radiation therapy: Secondary | ICD-10-CM | POA: Diagnosis not present

## 2017-07-04 ENCOUNTER — Ambulatory Visit (HOSPITAL_BASED_OUTPATIENT_CLINIC_OR_DEPARTMENT_OTHER): Payer: Medicare Other | Admitting: Hematology and Oncology

## 2017-07-04 ENCOUNTER — Ambulatory Visit
Admission: RE | Admit: 2017-07-04 | Discharge: 2017-07-04 | Disposition: A | Discharge status: Home / Self Care | Payer: Medicare Other | Source: Ambulatory Visit | Attending: Radiation Oncology | Admitting: Radiation Oncology

## 2017-07-04 ENCOUNTER — Telehealth: Payer: Self-pay | Admitting: Hematology and Oncology

## 2017-07-04 ENCOUNTER — Ambulatory Visit: Payer: Medicare Other

## 2017-07-04 DIAGNOSIS — C50411 Malignant neoplasm of upper-outer quadrant of right female breast: Secondary | ICD-10-CM

## 2017-07-04 DIAGNOSIS — Z170 Estrogen receptor positive status [ER+]: Secondary | ICD-10-CM

## 2017-07-04 DIAGNOSIS — Z510 Encounter for antineoplastic radiation therapy: Secondary | ICD-10-CM | POA: Diagnosis not present

## 2017-07-04 MED ORDER — ANASTROZOLE 1 MG PO TABS: 1.0000 mg | ORAL_TABLET | Freq: Every day | ORAL | 3 refills | Status: DC

## 2017-07-04 NOTE — Progress Notes (Signed)
Patient Care Team: [name], MD as PCP - General (Family Medicine)  DIAGNOSIS:  Encounter Diagnosis  Name Primary?  . Malignant neoplasm of upper-outer quadrant of right breast in female, estrogen receptor positive ([place])     SUMMARY OF ONCOLOGIC HISTORY:   Malignant neoplasm of upper-outer quadrant of right breast in female, estrogen receptor positive ([place])   04/19/2017 Surgery    Right lumpectomy: IDC grade 2, 1.3 cm, intermediate grade DCIS, margins negative, 0/2 lymph nodes negative, ER 100%, PR 100%, HER-2 negative ratio 1.15, Ki-67 60%, T1 CN 0 stage IA      04/19/2017 Oncotype testing    Oncotype score 26: Risk of distant recurrence 17% with hormonal therapy alone; patient did not want to undergo chemotherapy for a 4% benefit      06/05/2017 - 07/04/2017 Radiation Therapy    Adjuvant radiation therapy       CHIEF COMPLIANT: Follow-up after radiation therapy is complete  INTERVAL HISTORY: Erica Frazier is a 76 year old with above-mentioned history of right breast cancer treated with lumpectomy and just completed adjuvant radiation therapy.  She had a Oncotype score of 26 but elected not to undergo chemotherapy.  She is here today to discuss the adjuvant antiestrogen therapy plan.  She tolerated radiation fairly well.  She does have radiation dermatitis.  REVIEW OF SYSTEMS:   Constitutional: Denies fevers, chills or abnormal weight loss Eyes: Denies blurriness of vision Ears, nose, mouth, throat, and face: Denies mucositis or sore throat Respiratory: Denies cough, dyspnea or wheezes Cardiovascular: Denies palpitation, chest discomfort Gastrointestinal:  Denies nausea, heartburn or change in bowel habits Skin: Denies abnormal skin rashes Lymphatics: Denies new lymphadenopathy or easy bruising Neurological:Denies numbness, tingling or new weaknesses Behavioral/Psych: Mood is stable, no new changes  Extremities: No lower extremity edema Breast: Mild radiation  dermatitis All other systems were reviewed with the patient and are negative.  I have reviewed the past medical history, past surgical history, social history and family history with the patient and they are unchanged from previous note.  ALLERGIES:  has No Known Allergies.  MEDICATIONS:  Current Outpatient Medications  Medication Sig Dispense Refill  . anastrozole (ARIMIDEX) 1 MG tablet Take 1 tablet (1 mg total) daily by mouth. 90 tablet 3  . hyaluronate sodium (RADIAPLEXRX) GEL Apply 1 application topically 2 (two) times daily.    Marland Kitchen levothyroxine (SYNTHROID, LEVOTHROID) 125 MCG tablet Take 125 mcg by mouth daily before breakfast.    . Multiple Vitamin (MULTIVITAMIN WITH MINERALS) TABS tablet Take 1 tablet by mouth daily.    . naproxen sodium (ANAPROX) 220 MG tablet Take 220 mg by mouth as needed.    . non-metallic deodorant [name]) MISC Apply 1 application topically daily.     No current facility-administered medications for this visit.     PHYSICAL EXAMINATION: ECOG PERFORMANCE STATUS: 1 - Symptomatic but completely ambulatory  Vitals:   07/04/17 0839  BP: 117/65  Pulse: 78  Resp: 16  Temp: 97.9 F (36.6 C)  SpO2: 93%   Filed Weights   07/04/17 0839  Weight: 162 lb (73.5 kg)    GENERAL:alert, no distress and comfortable SKIN: skin color, texture, turgor are normal, no rashes or significant lesions EYES: normal, Conjunctiva are pink and non-injected, sclera clear OROPHARYNX:no exudate, no erythema and lips, buccal mucosa, and tongue normal  NECK: supple, thyroid normal size, non-tender, without nodularity LYMPH:  no palpable lymphadenopathy in the cervical, axillary or inguinal LUNGS: clear to auscultation and percussion with normal breathing effort  HEART: regular rate & rhythm and no murmurs and no lower extremity edema ABDOMEN:abdomen soft, non-tender and normal bowel sounds MUSCULOSKELETAL:no cyanosis of digits and no clubbing  NEURO: alert & oriented x 3 with  fluent speech, no focal motor/sensory deficits EXTREMITIES: No lower extremity edema  LABORATORY DATA:  I have reviewed the data as listed   Chemistry   No results found for: NA, K, CL, CO2, BUN, CREATININE, GLU No results found for: CALCIUM, ALKPHOS, AST, ALT, BILITOT     No results found for: WBC, HGB, HCT, MCV, PLT, NEUTROABS  ASSESSMENT & PLAN:  Malignant neoplasm of upper-outer quadrant of right breast in female, estrogen receptor positive ([name]) 04/19/2017: Right lumpectomy: IDC grade 2, 1.3 cm, intermediate grade DCIS, margins negative, 0/2 lymph nodes negative, ER 100%, PR 100%, HER-2 negative ratio 1.15, Ki-67 60%, T1 CN 0 stage IA  Oncotype DX score 26: Distant recurrence rate 17% with hormonal therapy alone. We discussed extensively with the risks and benefits of chemotherapy including the treatment with Taxotere and Cytoxan every 3 weeks 4 cycles. However after lengthy discussion she decided that she does not want to undergo systemic chemotherapy for a 4% benefit. 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  Recommendations: 1. Adjuvant radiation therapy 06/05/2017-07/04/2017 2. Adjuvant antiestrogen therapy with anastrozole 1 mg daily X 5 years.  Patient would like to start this August 22, 2017 after the holidays  Anastrozole counseling: We discussed the risks and benefits of anti-estrogen therapy with aromatase inhibitors. These include but not limited to insomnia, hot flashes, mood changes, vaginal dryness, bone density loss, and weight gain. We strongly believe that the benefits far outweigh the risks. Patient understands these risks and consented to starting treatment. Planned treatment duration is 5 years.  Return to clinic in 4 months with survivorship care plan visit.  I spent 25 minutes talking to the patient of which more than half was spent in counseling and coordination of care.  No orders of the  defined types were placed in this encounter.  The patient has a good understanding of the overall plan. she agrees with it. she will call with any problems that may develop before the next visit here.   Rulon Eisenmenger, MD 07/04/17

## 2017-07-04 NOTE — Assessment & Plan Note (Signed)
04/19/2017: Right lumpectomy: IDC grade 2, 1.3 cm, intermediate grade DCIS, margins negative, 0/2 lymph nodes negative, ER 100%, PR 100%, HER-2 negative ratio 1.15, Ki-67 60%, T1 CN 0 stage IA  Oncotype DX score 26: Distant recurrence rate 17% with hormonal therapy alone. We discussed extensively with the risks and benefits of chemotherapy including the treatment with Taxotere and Cytoxan every 3 weeks 4 cycles. However after lengthy discussion she decided that she does not want to undergo systemic chemotherapy for a 4% benefit. 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  Recommendations: 1. Adjuvant radiation therapy 06/05/2017-07/04/2017 2. Adjuvant antiestrogen therapy with anastrozole 1 mg daily X 5 years.  Anastrozole counseling: We discussed the risks and benefits of anti-estrogen therapy with aromatase inhibitors. These include but not limited to insomnia, hot flashes, mood changes, vaginal dryness, bone density loss, and weight gain. We strongly believe that the benefits far outweigh the risks. Patient understands these risks and consented to starting treatment. Planned treatment duration is 5 years.  Return to clinic in 3 months with survivorship care plan visit.

## 2017-07-04 NOTE — Telephone Encounter (Signed)
Gave patient appointment reminder card. Patient declined AVS and calendar of upcoming march appointments.

## 2017-07-05 ENCOUNTER — Ambulatory Visit: Payer: Medicare Other

## 2017-07-05 ENCOUNTER — Encounter: Payer: Self-pay | Admitting: Radiation Oncology

## 2017-07-05 ENCOUNTER — Ambulatory Visit
Admission: RE | Admit: 2017-07-05 | Discharge: 2017-07-05 | Disposition: A | Discharge status: Home / Self Care | Payer: Medicare Other | Source: Ambulatory Visit | Attending: Radiation Oncology | Admitting: Radiation Oncology

## 2017-07-05 DIAGNOSIS — Z510 Encounter for antineoplastic radiation therapy: Secondary | ICD-10-CM | POA: Diagnosis not present

## 2017-07-06 ENCOUNTER — Ambulatory Visit: Payer: Medicare Other

## 2017-07-07 ENCOUNTER — Ambulatory Visit: Payer: Medicare Other

## 2017-07-11 NOTE — Progress Notes (Signed)
  Radiation Oncology         (336) [phone] ________________________________  Name: Erica Frazier MRN: [id number]  Date: 07/05/2017  DOB: 01-Jan-1941  End of Treatment Note  Diagnosis:   Stage IA, pT1cN0cM0 grade 2, ER/PR positive invasive ductal carcinoma with intermediate grade DCIS of the right breast.     Indication for treatment:  Curative       Radiation treatment dates:   06/05/2017 to 07/05/2017  Site/dose:    1. The Right breast was treated to 42.5 Gy in 17 fractions of 2.5 Gy. 2. The Right breast was boosted to 7.5 Gy in 3 fractions of 2.5 Gy.  Beams/energy:    1. 3D // 10X, 6X 2. En face electron // 18E  Narrative: The patient tolerated radiation treatment relatively well.   She developed erythema in the treatment area without significant desquamation. She reports breast tenderness and some itching. She also reports mild fatigue.   Plan: The patient has completed radiation treatment. The patient will return to radiation oncology clinic for routine followup in one month. I advised them to call or return sooner if they have any questions or concerns related to their recovery or treatment.  ------------------------------------------------  Jodelle Gross, MD, PhD  This document serves as a record of services personally performed by Kyung Rudd, MD. It was created on his behalf by Arlyce Harman, a trained medical scribe. The creation of this record is based on the scribe's personal observations and the provider's statements to them. This document has been checked and approved by the attending provider.

## 2017-08-16 ENCOUNTER — Encounter: Payer: Self-pay | Admitting: Radiation Oncology

## 2017-08-16 ENCOUNTER — Other Ambulatory Visit: Payer: Self-pay

## 2017-08-16 ENCOUNTER — Ambulatory Visit
Admission: RE | Admit: 2017-08-16 | Discharge: 2017-08-16 | Disposition: A | Discharge status: Home / Self Care | Payer: Medicare Other | Source: Ambulatory Visit | Attending: Radiation Oncology | Admitting: Radiation Oncology

## 2017-08-16 VITALS — BP 123/70 | HR 80 | Temp 97.9°F | Resp 18 | Ht 66.0 in | Wt 163.2 lb

## 2017-08-16 DIAGNOSIS — Z79899 Other long term (current) drug therapy: Secondary | ICD-10-CM | POA: Insufficient documentation

## 2017-08-16 DIAGNOSIS — Z170 Estrogen receptor positive status [ER+]: Secondary | ICD-10-CM | POA: Insufficient documentation

## 2017-08-16 DIAGNOSIS — C50411 Malignant neoplasm of upper-outer quadrant of right female breast: Secondary | ICD-10-CM

## 2017-08-16 DIAGNOSIS — Z923 Personal history of irradiation: Secondary | ICD-10-CM | POA: Diagnosis not present

## 2017-08-16 DIAGNOSIS — C50911 Malignant neoplasm of unspecified site of right female breast: Secondary | ICD-10-CM | POA: Diagnosis present

## 2017-08-16 DIAGNOSIS — Z79811 Long term (current) use of aromatase inhibitors: Secondary | ICD-10-CM | POA: Diagnosis not present

## 2017-08-16 NOTE — Progress Notes (Signed)
  Radiation Oncology         [phone]) [phone] ________________________________  Name: Erica Frazier MRN: [id number]  Date of Service: 08/16/2017  DOB: 1941-05-03  Post Treatment Note  CC: Erica Austin, MD  Erica Lose, MD  Diagnosis:  Stage IA, pT1cN0cM0 grade 2, ER/PR positive invasive ductal carcinoma with intermediate grade DCIS of the right breast.    Interval Since Last Radiation: 6 weeks   06/05/2017 to 07/05/2017: 1. The Right breast was treated to 42.5 Gy in 17 fractions of 2.5 Gy. 2. The Right breast was boosted to 7.5 Gy in 3 fractions of 2.5 Gy.   Narrative:  The patient returns today for routine follow-up. During treatment she did very well with radiotherapy and did not have significant desquamation.                             On review of systems, the patient states she's doing well. She started Arimidex today and is feeling well. Her skin changes have resolved since completing treatment. No other complaints are noted.  ALLERGIES:  has No Known Allergies.  Meds: Current Outpatient Medications  Medication Sig Dispense Refill  . anastrozole (ARIMIDEX) 1 MG tablet Take 1 tablet (1 mg total) daily by mouth. 90 tablet 3  . levothyroxine (SYNTHROID, LEVOTHROID) 125 MCG tablet Take 125 mcg by mouth daily before breakfast.    . Multiple Vitamin (MULTIVITAMIN WITH MINERALS) TABS tablet Take 1 tablet by mouth daily.    . naproxen sodium (ANAPROX) 220 MG tablet Take 220 mg by mouth as needed.     No current facility-administered medications for this encounter.     Physical Findings:  height is 5\' 6"  (1.676 m) and weight is 163 lb 3.2 oz (74 kg). Her oral temperature is 97.9 F (36.6 C). Her blood pressure is 123/70 and her pulse is 80. Her respiration is 18 and oxygen saturation is 99%.  Pain Assessment Pain Score: 0-No pain/10 In general this is a well appearing caucasian female in no acute distress. She's alert and oriented x4 and appropriate throughout the  examination. Cardiopulmonary assessment is negative for acute distress and she exhibits normal effort. The right breast was examined and reveals no desquamation and only mild hyperpigmentation along the upper inner quadrant of the breast.   Lab Findings: No results found for: WBC, HGB, HCT, MCV, PLT   Radiographic Findings: No results found.  Impression/Plan: 1. Stage IA, pT1cN0cM0 grade 2, ER/PR positive invasive ductal carcinoma with intermediate grade DCIS of the right breast. The patient has been doing well since completion of radiotherapy. We discussed that we would be happy to continue to follow her as needed, but she will also continue taking Arimidex and follow up with Dr. [name] in medical oncology. She was counseled on skin care as well as measures to avoid sun exposure to this area.  2. Survivorship. She is scheduled in March for survivorship and we detailed the importance of this appointment.     [name], PAC

## 2017-08-16 NOTE — Addendum Note (Signed)
Encounter addended by: Malena Edman, RN on: 08/16/2017 11:14 AM  Actions taken: Charge Capture section accepted

## 2017-10-25 ENCOUNTER — Telehealth: Payer: Self-pay

## 2017-10-25 NOTE — Telephone Encounter (Signed)
Called left message reminding patient of appt for 3/12 at 0900.

## 2017-10-31 ENCOUNTER — Inpatient Hospital Stay: Payer: Medicare Other | Attending: Adult Health | Admitting: Adult Health

## 2017-10-31 ENCOUNTER — Telehealth: Payer: Self-pay | Admitting: Adult Health

## 2017-10-31 ENCOUNTER — Encounter: Payer: Self-pay | Admitting: Adult Health

## 2017-10-31 VITALS — BP 120/64 | HR 86 | Temp 97.9°F | Resp 17 | Ht 66.0 in | Wt 162.7 lb

## 2017-10-31 DIAGNOSIS — Z923 Personal history of irradiation: Secondary | ICD-10-CM | POA: Diagnosis not present

## 2017-10-31 DIAGNOSIS — M1990 Unspecified osteoarthritis, unspecified site: Secondary | ICD-10-CM | POA: Insufficient documentation

## 2017-10-31 DIAGNOSIS — R232 Flushing: Secondary | ICD-10-CM | POA: Insufficient documentation

## 2017-10-31 DIAGNOSIS — Z170 Estrogen receptor positive status [ER+]: Secondary | ICD-10-CM | POA: Insufficient documentation

## 2017-10-31 DIAGNOSIS — Z79811 Long term (current) use of aromatase inhibitors: Secondary | ICD-10-CM | POA: Diagnosis not present

## 2017-10-31 DIAGNOSIS — Z79899 Other long term (current) drug therapy: Secondary | ICD-10-CM | POA: Diagnosis not present

## 2017-10-31 DIAGNOSIS — Z87891 Personal history of nicotine dependence: Secondary | ICD-10-CM | POA: Insufficient documentation

## 2017-10-31 DIAGNOSIS — E039 Hypothyroidism, unspecified: Secondary | ICD-10-CM | POA: Diagnosis not present

## 2017-10-31 DIAGNOSIS — C50411 Malignant neoplasm of upper-outer quadrant of right female breast: Secondary | ICD-10-CM | POA: Diagnosis present

## 2017-10-31 DIAGNOSIS — E2839 Other primary ovarian failure: Secondary | ICD-10-CM

## 2017-10-31 NOTE — Progress Notes (Signed)
CLINIC:  Survivorship   REASON FOR VISIT:  Routine follow-up post-treatment for a recent history of breast cancer.  BRIEF ONCOLOGIC HISTORY:    Malignant neoplasm of upper-outer quadrant of right breast in female, estrogen receptor positive ([place])   04/19/2017 Surgery    Right lumpectomy: IDC grade 2, 1.3 cm, intermediate grade DCIS, margins negative, 0/2 lymph nodes negative, ER 100%, PR 100%, HER-2 negative ratio 1.15, Ki-67 60%, T1 CN 0 stage IA      04/19/2017 Oncotype testing    Oncotype score 26: Risk of distant recurrence 17% with hormonal therapy alone; patient did not want to undergo chemotherapy for a 4% benefit      06/05/2017 - 07/04/2017 Radiation Therapy    Adjuvant radiation therapy      08/2017 -  Anti-estrogen oral therapy    Anastrozole daily       INTERVAL HISTORY:  Ms. Nichelson presents to the [facility] today for our initial meeting to review her survivorship care plan detailing her treatment course for breast cancer, as well as monitoring long-term side effects of that treatment, education regarding health maintenance, screening, and overall wellness and health promotion.     Overall, Ms. Obriant reports feeling quite well.  She is taking Anastrozole daily.  She does have some joint aches.  She is taking Aleve for these, and continues to exercise, work, and eat healthy.  She is also having some hot flashes, 3-4 at night.      REVIEW OF SYSTEMS:  Review of Systems  Constitutional: Negative for appetite change, chills, fatigue, fever and unexpected weight change.  HENT:   Negative for hearing loss, lump/mass and trouble swallowing.   Eyes: Negative for eye problems and icterus.  Respiratory: Negative for chest tightness, cough and shortness of breath.   Cardiovascular: Negative for chest pain, leg swelling and palpitations.  Gastrointestinal: Negative for abdominal pain, constipation, diarrhea, nausea and vomiting.  Endocrine: Negative for hot  flashes.  Skin: Negative for itching and rash.  Neurological: Negative for dizziness, extremity weakness, headaches and numbness.  Hematological: Negative for adenopathy. Does not bruise/bleed easily.  Psychiatric/Behavioral: Negative for depression. The patient is not nervous/anxious.   Breast: Denies any new nodularity, masses, tenderness, nipple changes, or nipple discharge.     ONCOLOGY TREATMENT TEAM:  1. Surgeon:  Dr. Brantley Stage  at [facility] 2. Medical Oncologist: Dr. [name]  3. Radiation Oncologist: Dr. Lisbeth Renshaw    PAST MEDICAL/SURGICAL HISTORY:  Past Medical History:  Diagnosis Date  . Arthritis    osteoarthritis hands  . Cancer ([place]) 03/2017   DCIS right breast  . Hypothyroidism    Past Surgical History:  Procedure Laterality Date  . ANKLE HARDWARE REMOVAL Left   . BREAST LUMPECTOMY WITH RADIOACTIVE SEED AND SENTINEL LYMPH NODE BIOPSY Right 04/19/2017   Procedure: RIGHT BREAST LUMPECTOMY WITH RADIOACTIVE SEED AND RIGHT SENTINEL LYMPH NODE BIOPSY;  Surgeon: Erroll Luna, MD;  Location: [facility];  Service: General;  Laterality: Right;  . ORIF ANKLE FRACTURE Left      ALLERGIES:  No Known Allergies   CURRENT MEDICATIONS:  Outpatient Encounter Medications as of 10/31/2017  Medication Sig  . anastrozole (ARIMIDEX) 1 MG tablet Take 1 tablet (1 mg total) daily by mouth.  . levothyroxine (SYNTHROID, LEVOTHROID) 125 MCG tablet Take 125 mcg by mouth daily before breakfast.  . Multiple Vitamin (MULTIVITAMIN WITH MINERALS) TABS tablet Take 1 tablet by mouth daily.  . naproxen sodium (ANAPROX) 220 MG tablet Take 220 mg  by mouth as needed.   No facility-administered encounter medications on file as of 10/31/2017.      ONCOLOGIC FAMILY HISTORY:  Non contributory   GENETIC COUNSELING/TESTING: Not at this time  SOCIAL HISTORY:  TERENCE GOOGE is divorced and lives alone in [place], [place].  She has one son who lives in  [place], [place].  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  She denies any current or history of tobacco, alcohol, or illicit drug use.     PHYSICAL EXAMINATION:  Vital Signs:   Vitals:   10/31/17 0923  BP: 120/64  Pulse: 86  Resp: 17  Temp: 97.9 F (36.6 C)  SpO2: 96%   Filed Weights   10/31/17 0923  Weight: 162 lb 11.2 oz (73.8 kg)   General: Well-nourished, well-appearing female in no acute distress.  She is unaccompanied today.   HEENT: Head is normocephalic.  Pupils equal and reactive to light. Conjunctivae clear without exudate.  Sclerae anicteric. Oral mucosa is pink, moist.  Oropharynx is pink without lesions or erythema.  Lymph: No cervical, supraclavicular, or infraclavicular lymphadenopathy noted on palpation.  Cardiovascular: Regular rate and rhythm.Marland Kitchen Respiratory: Clear to auscultation bilaterally. Chest expansion symmetric; breathing non-labored.  Breast: Right breast s/p lumpectomy, radiation discoloration noted, no masses, nodules, or signs of recurrence, left breast without nodules, masses, skin or nipple changes. GI: Abdomen soft and round; non-tender, non-distended. Bowel sounds normoactive.  GU: Deferred.  Neuro: No focal deficits. Steady gait.  Psych: Mood and affect normal and appropriate for situation.  Extremities: No edema. MSK: No focal spinal tenderness to palpation.  Full range of motion in bilateral upper extremities Skin: Warm and dry.  LABORATORY DATA:  None for this visit.  DIAGNOSTIC IMAGING:  None for this visit.      ASSESSMENT AND PLAN:  Ms.. Roscher is a pleasant 77 y.o. female with Stage IA right breast invasive ductal carcinoma, ER+/PR+/HER2-, diagnosed in 03/2017, treated with lumpectomy, adjuvant radiation therapy, and anti-estrogen therapy with Anastrozole beginning in 08/2017.  She presents to the Survivorship Clinic for our initial meeting and routine follow-up post-completion of treatment for breast cancer.    1.  Stage IA rightt breast cancer:  Ms. Neff is continuing to recover from definitive treatment for breast cancer. She will follow-up with hermedical oncologist, Dr. [name] in 6 months with history and physical exam per surveillance protocol.  She will continue her anti-estrogen therapy with Anastrozole. Thus far, she is tolerating the Anastrozole well, with minimal side effects such as joint aches and hot flashes that are tolerable.  I did counsel her on taking tylenol instead of aleve daily if possible as a long term medication, due to the cardiac risk with Aleve.  I also ordered her mammogram for August, 2019 when it is due.  Today, a comprehensive survivorship care plan and treatment summary was reviewed with the patient today detailing her breast cancer diagnosis, treatment course, potential late/long-term effects of treatment, appropriate follow-up care with recommendations for the future, and patient education resources.  A copy of this summary, along with a letter will be sent to the patient's primary care provider via mail/fax/In Basket message after today's visit.    2. Bone health:  Given Ms. Knebel's age/history of breast cancer and her current treatment regimen including anti-estrogen therapy with Anastrozole, she is at risk for bone demineralization.  She says that her last bone density was normal the last time it was checked, but that was several years ago.  I ordered a repeat bone density in August, 2019 when she undergoes her mammogram.  In the meantime, she was encouraged to increase her consumption of foods rich in calcium, as well as increase her weight-bearing activities.  She was given education on specific activities to promote bone health.  3. Cancer screening:  Due to Ms. Racanelli's history and her age, she should receive screening for skin cancers, colon cancer.  The information and recommendations are listed on the patient's comprehensive care plan/treatment summary and were reviewed in  detail with the patient.    4. Health maintenance and wellness promotion: Ms. Polasek was encouraged to consume 5-7 servings of fruits and vegetables per day. We reviewed the "Nutrition Rainbow" handout, as well as the handout "Take Control of Your Health and Reduce Your Cancer Risk" from the [employer].  She was also encouraged to engage in moderate to vigorous exercise for 30 minutes per day most days of the week. We discussed the [employer] fitness program, which is designed for cancer survivors to help them become more physically fit after cancer treatments.  She was instructed to limit her alcohol consumption and continue to abstain from tobacco use.     5. Support services/counseling: It is not uncommon for this period of the patient's cancer care trajectory to be one of many emotions and stressors.  We discussed an opportunity for her to participate in the next session of [facility] [place] ("Finding Your New Normal") support group series designed for patients after they have completed treatment.   Ms. Gillock was encouraged to take advantage of our many other support services programs, support groups, and/or counseling in coping with her new life as a cancer survivor after completing anti-cancer treatment.  She was offered support today through active listening and expressive supportive counseling.  She was given information regarding our available services and encouraged to contact me with any questions or for help enrolling in any of our support group/programs.    Dispo:   -Return to cancer center in 6 months for follow up with Dr. [name] -Mammogram due in 03/2018 -Bone density in 03/2018 -Follow up with Dr. Brantley Stage in one year -She is welcome to return back to the Survivorship Clinic at any time; no additional follow-up needed at this time.  -Consider referral back to survivorship as a long-term survivor for continued surveillance  A total of (30) minutes of face-to-face time was spent  with this patient with greater than 50% of that time in counseling and care-coordination.   Gardenia Phlegm, NP Survivorship Program [place] [phone]   Note: PRIMARY CARE PROVIDER [name], [place] [phone]

## 2017-10-31 NOTE — Telephone Encounter (Signed)
Gave patient AVS and calendar of upcoming September appointments.  °

## 2018-03-26 ENCOUNTER — Ambulatory Visit
Admission: RE | Admit: 2018-03-26 | Discharge: 2018-03-26 | Disposition: A | Discharge status: Home / Self Care | Payer: Medicare Other | Source: Ambulatory Visit | Attending: Adult Health | Admitting: Adult Health

## 2018-03-26 DIAGNOSIS — Z170 Estrogen receptor positive status [ER+]: Principal | ICD-10-CM

## 2018-03-26 DIAGNOSIS — C50411 Malignant neoplasm of upper-outer quadrant of right female breast: Secondary | ICD-10-CM

## 2018-03-26 HISTORY — DX: Personal history of irradiation: Z92.3

## 2018-04-30 ENCOUNTER — Telehealth: Payer: Self-pay | Admitting: Hematology and Oncology

## 2018-04-30 ENCOUNTER — Inpatient Hospital Stay: Payer: Medicare Other | Attending: Hematology and Oncology | Admitting: Hematology and Oncology

## 2018-04-30 DIAGNOSIS — Z923 Personal history of irradiation: Secondary | ICD-10-CM | POA: Insufficient documentation

## 2018-04-30 DIAGNOSIS — K219 Gastro-esophageal reflux disease without esophagitis: Secondary | ICD-10-CM

## 2018-04-30 DIAGNOSIS — Z79899 Other long term (current) drug therapy: Secondary | ICD-10-CM

## 2018-04-30 DIAGNOSIS — Z170 Estrogen receptor positive status [ER+]: Secondary | ICD-10-CM | POA: Diagnosis not present

## 2018-04-30 DIAGNOSIS — Z87891 Personal history of nicotine dependence: Secondary | ICD-10-CM

## 2018-04-30 DIAGNOSIS — C50411 Malignant neoplasm of upper-outer quadrant of right female breast: Secondary | ICD-10-CM | POA: Insufficient documentation

## 2018-04-30 DIAGNOSIS — M810 Age-related osteoporosis without current pathological fracture: Secondary | ICD-10-CM

## 2018-04-30 DIAGNOSIS — Z79811 Long term (current) use of aromatase inhibitors: Secondary | ICD-10-CM | POA: Insufficient documentation

## 2018-04-30 DIAGNOSIS — Z9221 Personal history of antineoplastic chemotherapy: Secondary | ICD-10-CM

## 2018-04-30 DIAGNOSIS — C50412 Malignant neoplasm of upper-outer quadrant of left female breast: Secondary | ICD-10-CM

## 2018-04-30 DIAGNOSIS — Z85828 Personal history of other malignant neoplasm of skin: Secondary | ICD-10-CM

## 2018-04-30 DIAGNOSIS — M1990 Unspecified osteoarthritis, unspecified site: Secondary | ICD-10-CM

## 2018-04-30 DIAGNOSIS — I10 Essential (primary) hypertension: Secondary | ICD-10-CM

## 2018-04-30 MED ORDER — ANASTROZOLE 1 MG PO TABS: 1.0000 mg | ORAL_TABLET | Freq: Every day | ORAL | 3 refills | Status: DC

## 2018-04-30 NOTE — Telephone Encounter (Signed)
Gave ava and calendar

## 2018-04-30 NOTE — Assessment & Plan Note (Signed)
04/19/2017: Right lumpectomy: IDC grade 2, 1.3 cm, intermediate grade DCIS, margins negative, 0/2 lymph nodes negative, ER 100%, PR 100%, HER-2 negative ratio 1.15, Ki-67 60%, T1 CN 0 stage IA Oncotype DX recurrence score 26: Distant recurrence rate 17% with hormone therapy alone Adjuvant radiation therapy 06/05/2017 to 07/04/2018  Current treatment: Anastrozole 1 mg daily started August 22, 2017. Anastrozole toxicities:  Breast cancer surveillance: 1.  Mammogram 03/26/2018: No evidence of malignancy, breast density category B 2. breast exam 10/31/2017: Benign  Return to clinic in 1 year for follow-up

## 2018-04-30 NOTE — Progress Notes (Signed)
Patient Care Team: [name], MD as PCP - General (Family Medicine) [name], MD as Consulting Physician (Hematology and Oncology) Kyung Rudd, MD as Consulting Physician (Radiation Oncology) Erroll Luna, MD as Consulting Physician (General Surgery) Delice Bison Charlestine Massed, NP as Nurse Practitioner (Hematology and Oncology)  DIAGNOSIS:  Encounter Diagnosis  Name Primary?  . Malignant neoplasm of upper-outer quadrant of right breast in female, estrogen receptor positive ([place])     SUMMARY OF ONCOLOGIC HISTORY:   Malignant neoplasm of upper-outer quadrant of right breast in female, estrogen receptor positive ([place])   04/19/2017 Surgery    Right lumpectomy: IDC grade 2, 1.3 cm, intermediate grade DCIS, margins negative, 0/2 lymph nodes negative, ER 100%, PR 100%, HER-2 negative ratio 1.15, Ki-67 60%, T1 CN 0 stage IA    04/19/2017 Oncotype testing    Oncotype score 26: Risk of distant recurrence 17% with hormonal therapy alone; patient did not want to undergo chemotherapy for a 4% benefit    06/05/2017 - 07/04/2017 Radiation Therapy    Adjuvant radiation therapy    08/2017 -  Anti-estrogen oral therapy    Anastrozole daily     CHIEF COMPLIANT: Follow-up on anastrozole therapy  INTERVAL HISTORY: Erica Frazier is a 79-year with above-mentioned history of right breast cancer currently on anastrozole therapy.  She appears to be complaining of mild to moderate fatigue as well as occasional hot flashes especially at night and muscle stiffness.  In spite of all of this she appears to be doing quite well.  It has not slowed her down.  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  REVIEW OF SYSTEMS:   Constitutional: Denies fevers, chills or abnormal weight loss Eyes: Denies blurriness of vision Ears, nose, mouth, throat, and face: Denies mucositis or sore throat Respiratory: Denies cough, dyspnea or  wheezes Cardiovascular: Denies palpitation, chest discomfort Gastrointestinal:  Denies nausea, heartburn or change in bowel habits Skin: Denies abnormal skin rashes Lymphatics: Denies new lymphadenopathy or easy bruising Neurological:Denies numbness, tingling or new weaknesses Behavioral/Psych: Mood is stable, no new changes  Extremities: No lower extremity edema Breast:  denies any pain or lumps or nodules in either breasts All other systems were reviewed with the patient and are negative.  I have reviewed the past medical history, past surgical history, social history and family history with the patient and they are unchanged from previous note.  ALLERGIES:  has No Known Allergies.  MEDICATIONS:  Current Outpatient Medications  Medication Sig Dispense Refill  . anastrozole (ARIMIDEX) 1 MG tablet Take 1 tablet (1 mg total) daily by mouth. 90 tablet 3  . levothyroxine (SYNTHROID, LEVOTHROID) 125 MCG tablet Take 125 mcg by mouth daily before breakfast.    . Multiple Vitamin (MULTIVITAMIN WITH MINERALS) TABS tablet Take 1 tablet by mouth daily.    . naproxen sodium (ANAPROX) 220 MG tablet Take 220 mg by mouth as needed.     No current facility-administered medications for this visit.     PHYSICAL EXAMINATION: ECOG PERFORMANCE STATUS: 1 - Symptomatic but completely ambulatory  Vitals:   04/30/18 0846  BP: 129/65  Pulse: 78  Resp: 19  Temp: 98.1 F (36.7 C)  SpO2: 98%   Filed Weights   04/30/18 0846  Weight: 156 lb 14.4 oz (71.2 kg)    GENERAL:alert, no distress and comfortable SKIN: skin color, texture, turgor are normal, no rashes or significant lesions EYES: normal, Conjunctiva are pink  and non-injected, sclera clear OROPHARYNX:no exudate, no erythema and lips, buccal mucosa, and tongue normal  NECK: supple, thyroid normal size, non-tender, without nodularity LYMPH:  no palpable lymphadenopathy in the cervical, axillary or inguinal LUNGS: clear to auscultation and  percussion with normal breathing effort HEART: regular rate & rhythm and no murmurs and no lower extremity edema ABDOMEN:abdomen soft, non-tender and normal bowel sounds MUSCULOSKELETAL:no cyanosis of digits and no clubbing  NEURO: alert & oriented x 3 with fluent speech, no focal motor/sensory deficits EXTREMITIES: No lower extremity edema BREAST: No palpable masses or nodules in either right or left breasts. No palpable axillary supraclavicular or infraclavicular adenopathy no breast tenderness or nipple discharge. (exam performed in the presence of a chaperone)  Exam aSSESSMENT & PLAN:  Malignant neoplasm of upper-outer quadrant of right breast in female, estrogen receptor positive ([place]) 04/19/2017: Right lumpectomy: IDC grade 2, 1.3 cm, intermediate grade DCIS, margins negative, 0/2 lymph nodes negative, ER 100%, PR 100%, HER-2 negative ratio 1.15, Ki-67 60%, T1 CN 0 stage IA Oncotype DX recurrence score 26: Distant recurrence rate 17% with hormone therapy alone Adjuvant radiation therapy 06/05/2017 to 07/04/2018  Current treatment: Anastrozole 1 mg daily started August 22, 2017. Anastrozole toxicities:  Breast cancer surveillance: 1.  Mammogram 03/26/2018: No evidence of malignancy, breast density category B 2. breast exam 10/31/2017: Benign  Return to clinic in 1 year for follow-up    No orders of the defined types were placed in this encounter.  The patient has a good understanding of the overall plan. she agrees with it. she will call with any problems that may develop before the next visit here.   Erica Ohara, MD 04/30/18

## 2018-11-29 IMAGING — US US BREAST BX W LOC DEV 1ST LESION IMG BX SPEC US GUIDE*R*
1 series · 12 of 12 positions shown · non-contrast
Comparison: Previous exam(s).

ADDENDUM:
Pathology revealed grade I to II invasive ductal carcinoma and
ductal carcinoma in situ in the RIGHT breast. This was found to be
concordant by Dr. Nkanyi Bolani. Pathology results were discussed
with the patient by telephone. The patient reported doing well after
the biopsy. Post biopsy instructions and care were reviewed and
questions were answered. The patient was encouraged to call The
Surgical consultation has been arranged with Dr. Farheen Munn at
[REDACTED] on April 10, 2017.

Pathology results reported by Binha Hinkel RN, BSN on 03/31/2017.
CLINICAL DATA: Right breast mass for biopsy
EXAM:
ULTRASOUND GUIDED RIGHT BREAST CORE NEEDLE BIOPSY

[Series 1: us breast bx w loc dev 1st lesion img bx spec us g · 0.07mm/px · 12 of 12 slices shown]
[im 1/12]
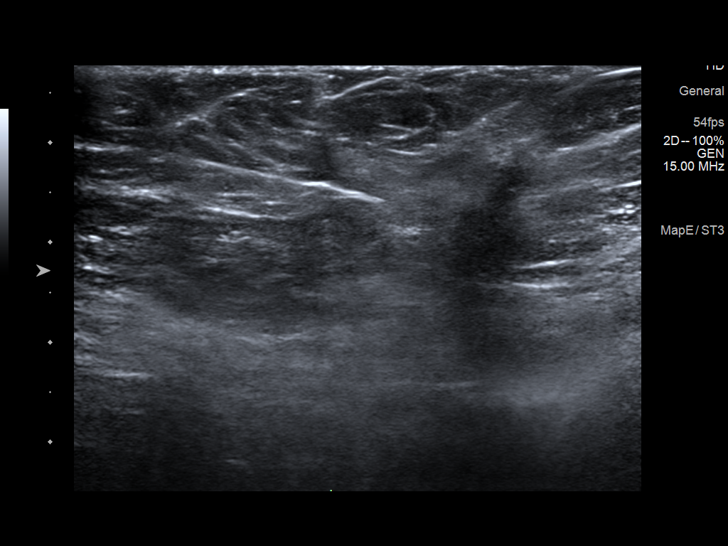
[im 2/12]
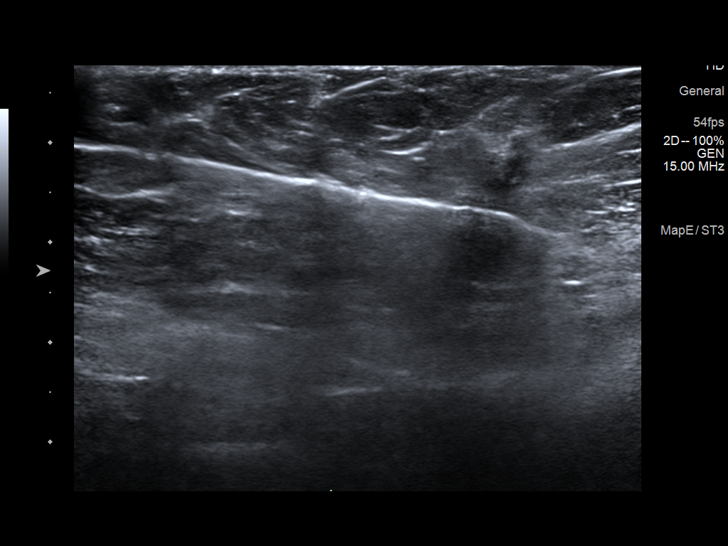
[im 3/12]
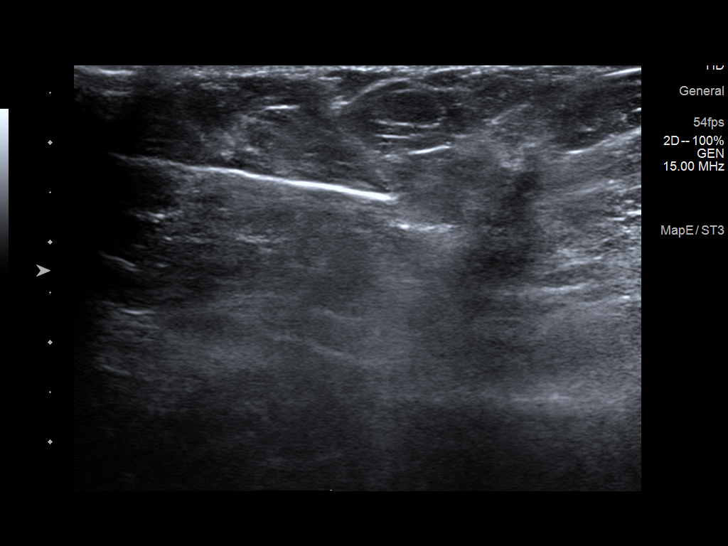
[im 4/12]
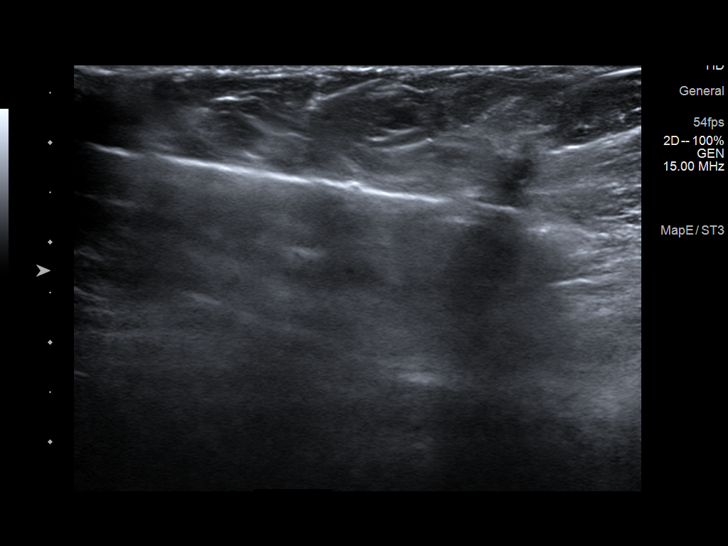
[im 5/12]
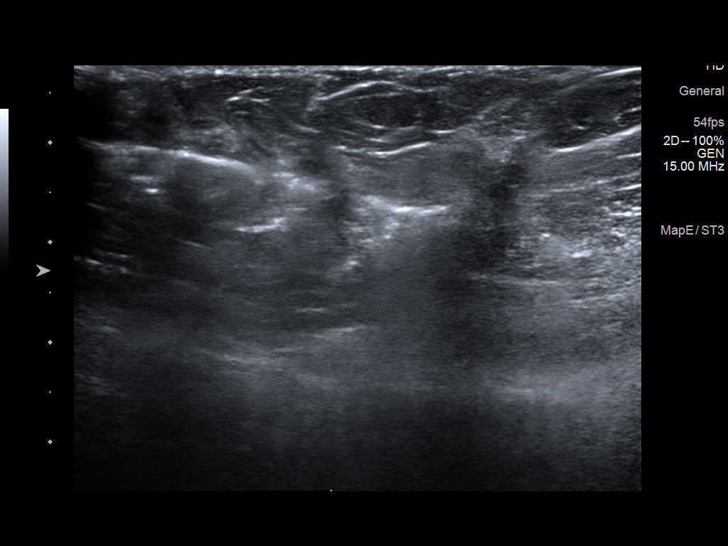
[im 6/12]
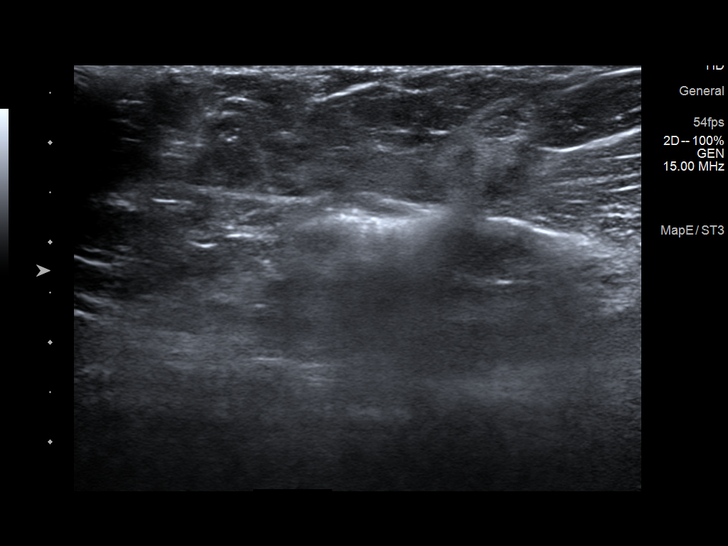
[im 7/12]
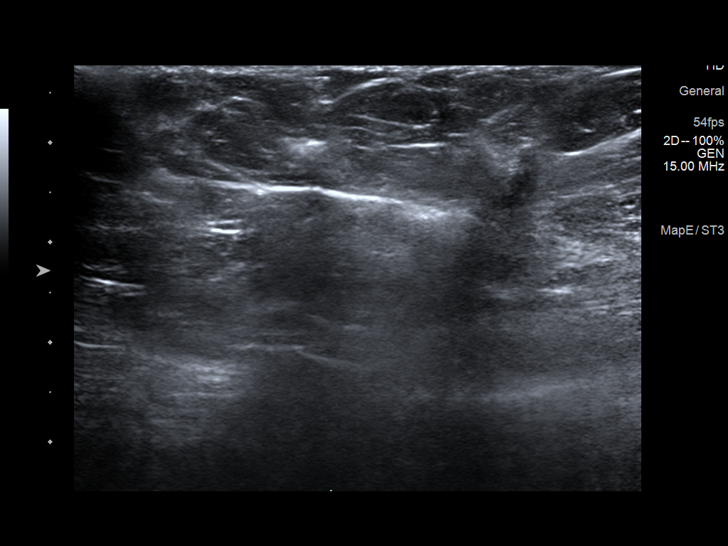
[im 8/12]
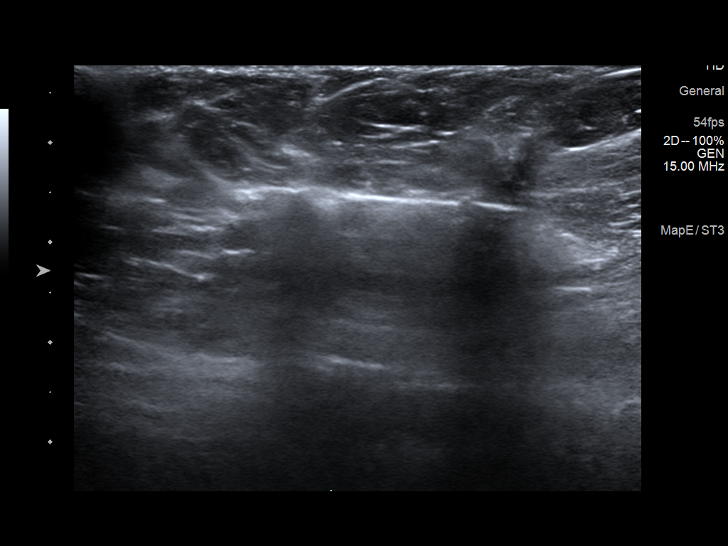
[im 9/12]
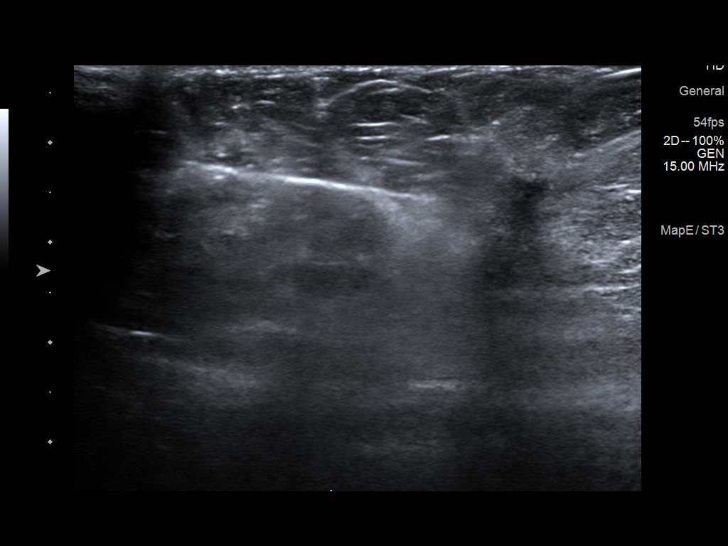
[im 10/12]
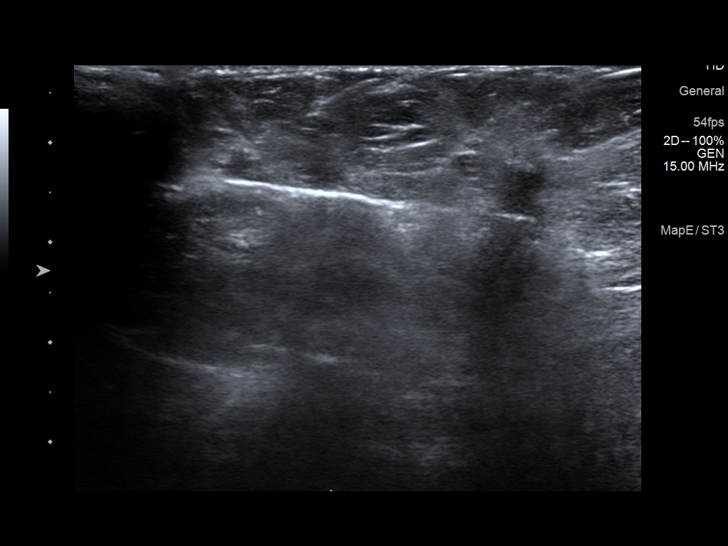
[im 11/12]
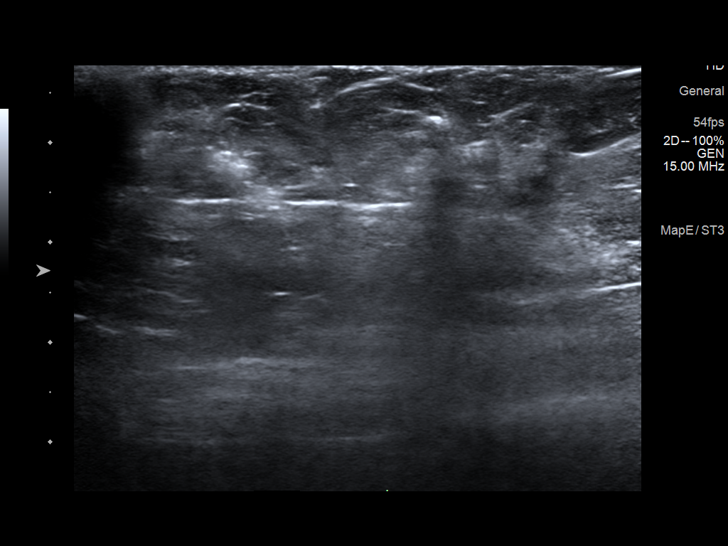
[im 12/12]
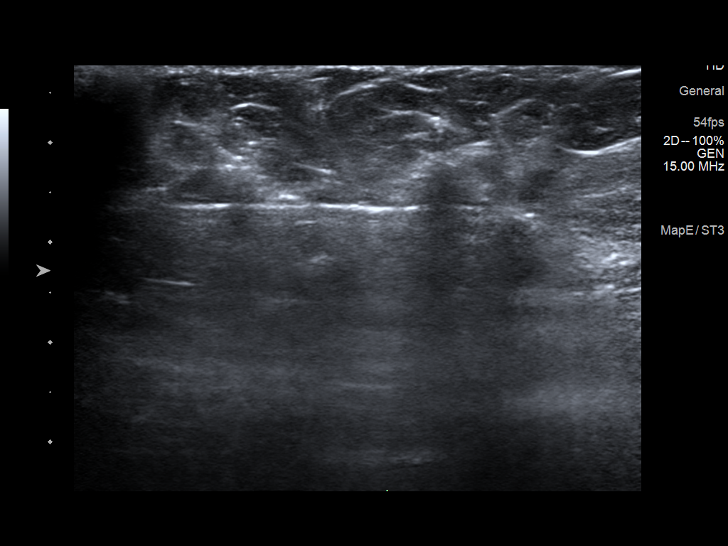

[12 of 12 positions shown; findings below may reference images not displayed]



Lesion quadrant: Lower outer quadrant

Using sterile technique and 1% Lidocaine as local anesthetic, under
direct ultrasound visualization, a 12 gauge Kolenti device was
used to perform biopsy of mass at the right breast 8 o'clock using a
lateral approach. At the conclusion of the procedure a ribbon tissue
marker clip was deployed into the biopsy cavity. Follow up 2 view
mammogram was performed and dictated separately.
IMPRESSION: Ultrasound guided biopsy of right breast. No apparent complications.

## 2019-02-18 ENCOUNTER — Other Ambulatory Visit: Payer: Self-pay | Admitting: Adult Health

## 2019-02-18 DIAGNOSIS — Z853 Personal history of malignant neoplasm of breast: Secondary | ICD-10-CM

## 2019-03-29 ENCOUNTER — Ambulatory Visit
Admission: RE | Admit: 2019-03-29 | Discharge: 2019-03-29 | Disposition: A | Discharge status: Home / Self Care | Payer: Medicare Other | Source: Ambulatory Visit | Attending: Adult Health | Admitting: Adult Health

## 2019-03-29 ENCOUNTER — Other Ambulatory Visit: Payer: Self-pay

## 2019-03-29 DIAGNOSIS — Z853 Personal history of malignant neoplasm of breast: Secondary | ICD-10-CM

## 2019-04-30 NOTE — Progress Notes (Signed)
Patient Care Team: [name], MD (Inactive) as PCP - General (Family Medicine) [name], MD as Consulting Physician (Hematology and Oncology) Kyung Rudd, MD as Consulting Physician (Radiation Oncology) Erroll Luna, MD as Consulting Physician (General Surgery) Delice Bison, Charlestine Massed, NP as Nurse Practitioner (Hematology and Oncology)  DIAGNOSIS:    ICD-10-CM   1. Malignant neoplasm of upper-outer quadrant of right breast in female, estrogen receptor positive ([place])  C50.411    Z17.0     SUMMARY OF ONCOLOGIC HISTORY: Oncology History  Malignant neoplasm of upper-outer quadrant of right breast in female, estrogen receptor positive ([name])  04/19/2017 Surgery   Right lumpectomy: IDC grade 2, 1.3 cm, intermediate grade DCIS, margins negative, 0/2 lymph nodes negative, ER 100%, PR 100%, HER-2 negative ratio 1.15, Ki-67 60%, T1 CN 0 stage IA   04/19/2017 Oncotype testing   Oncotype score 26: Risk of distant recurrence 17% with hormonal therapy alone; patient did not want to undergo chemotherapy for a 4% benefit   06/05/2017 - 07/04/2017 Radiation Therapy   Adjuvant radiation therapy   08/2017 -  Anti-estrogen oral therapy   Anastrozole daily     CHIEF COMPLIANT: Follow-up of right breast cancer on anastrozole therapy  INTERVAL HISTORY: Erica Frazier is a 78 y.o. with above-mentioned history of right breast cancer treated with lumpectomy, radiation, and who is currently on anti-estrogen therapy with anastrozole. I last saw her a year ago. Mammogram on 03/29/19 showed no evidence of malignancy bilaterally. She presents to the clinic today for annual follow-up.  She has been tolerating antiestrogen therapy extremely well.  Denies any lumps or nodules in the breast.  REVIEW OF SYSTEMS:   Constitutional: Denies fevers, chills or abnormal weight loss Eyes: Denies blurriness of vision Ears, nose, mouth, throat, and face: Denies mucositis or sore throat Respiratory: Denies  cough, dyspnea or wheezes Cardiovascular: Denies palpitation, chest discomfort Gastrointestinal: Denies nausea, heartburn or change in bowel habits Skin: Denies abnormal skin rashes Lymphatics: Denies new lymphadenopathy or easy bruising Neurological: Denies numbness, tingling or new weaknesses Behavioral/Psych: Mood is stable, no new changes  Extremities: No lower extremity edema Breast: denies any pain or lumps or nodules in either breasts All other systems were reviewed with the patient and are negative.  I have reviewed the past medical history, past surgical history, social history and family history with the patient and they are unchanged from previous note.  ALLERGIES:  has No Known Allergies.  MEDICATIONS:  Current Outpatient Medications  Medication Sig Dispense Refill  . anastrozole (ARIMIDEX) 1 MG tablet Take 1 tablet (1 mg total) by mouth daily. 90 tablet 3  . levothyroxine (SYNTHROID, LEVOTHROID) 125 MCG tablet Take 125 mcg by mouth daily before breakfast.    . Multiple Vitamin (MULTIVITAMIN WITH MINERALS) TABS tablet Take 1 tablet by mouth daily.    . naproxen sodium (ANAPROX) 220 MG tablet Take 220 mg by mouth as needed.     No current facility-administered medications for this visit.     PHYSICAL EXAMINATION: ECOG PERFORMANCE STATUS: 0 - Asymptomatic  Vitals:   05/01/19 0850  BP: 115/68  Pulse: 88  Resp: 17  Temp: 98.2 F (36.8 C)  SpO2: 98%   Filed Weights   05/01/19 0850  Weight: 157 lb (71.2 kg)    GENERAL: alert, no distress and comfortable SKIN: skin color, texture, turgor are normal, no rashes or significant lesions EYES: normal, Conjunctiva are pink and non-injected, sclera clear OROPHARYNX: no exudate, no erythema and lips, buccal mucosa, and tongue  normal  NECK: supple, thyroid normal size, non-tender, without nodularity LYMPH: no palpable lymphadenopathy in the cervical, axillary or inguinal LUNGS: clear to auscultation and percussion with  normal breathing effort HEART: regular rate & rhythm and no murmurs and no lower extremity edema ABDOMEN: abdomen soft, non-tender and normal bowel sounds MUSCULOSKELETAL: no cyanosis of digits and no clubbing  NEURO: alert & oriented x 3 with fluent speech, no focal motor/sensory deficits EXTREMITIES: No lower extremity edema BREAST: No palpable masses or nodules in either right or left breasts. No palpable axillary supraclavicular or infraclavicular adenopathy no breast tenderness or nipple discharge. (exam performed in the presence of a chaperone)  LABORATORY DATA:  I have reviewed the data as listed No flowsheet data found.  No results found for: WBC, HGB, HCT, MCV, PLT, NEUTROABS  ASSESSMENT & PLAN:  Malignant neoplasm of upper-outer quadrant of right breast in female, estrogen receptor positive ([name]) 04/19/2017: Right lumpectomy: IDC grade 2, 1.3 cm, intermediate grade DCIS, margins negative, 0/2 lymph nodes negative, ER 100%, PR 100%, HER-2 negative ratio 1.15, Ki-67 60%, T1 CN 0 stage IA Oncotype DX recurrence score 26: Distant recurrence rate 17% with hormone therapy alone Adjuvant radiation therapy 06/05/2017 to 07/04/2018  Current treatment: Anastrozole 1 mg daily started August 22, 2017. Anastrozole toxicities: Denies any adverse effects to anastrozole therapy. Encouraged her to get a bone density every 2 years  Breast cancer surveillance: 1.  Mammogram 03/29/2019: No evidence of malignancy, breast density category B 2. breast exam 05/01/2019: Benign  Return to clinic in 1 year for follow-up    No orders of the defined types were placed in this encounter.  The patient has a good understanding of the overall plan. she agrees with it. she will call with any problems that may develop before the next visit here.  [name], MD 05/01/2019  Julious Oka Dorshimer am acting as scribe for Dr. [name].  I have reviewed the above documentation for accuracy and  completeness, and I agree with the above.

## 2019-05-01 ENCOUNTER — Other Ambulatory Visit: Payer: Self-pay

## 2019-05-01 ENCOUNTER — Inpatient Hospital Stay: Payer: Medicare Other | Attending: Hematology and Oncology | Admitting: Hematology and Oncology

## 2019-05-01 DIAGNOSIS — Z170 Estrogen receptor positive status [ER+]: Secondary | ICD-10-CM | POA: Diagnosis not present

## 2019-05-01 DIAGNOSIS — C50411 Malignant neoplasm of upper-outer quadrant of right female breast: Secondary | ICD-10-CM | POA: Diagnosis present

## 2019-05-01 DIAGNOSIS — Z79811 Long term (current) use of aromatase inhibitors: Secondary | ICD-10-CM | POA: Diagnosis not present

## 2019-05-01 DIAGNOSIS — Z79899 Other long term (current) drug therapy: Secondary | ICD-10-CM | POA: Insufficient documentation

## 2019-05-01 DIAGNOSIS — Z923 Personal history of irradiation: Secondary | ICD-10-CM | POA: Diagnosis not present

## 2019-05-01 MED ORDER — ANASTROZOLE 1 MG PO TABS: 1.0000 mg | ORAL_TABLET | Freq: Every day | ORAL | 3 refills | Status: DC

## 2019-05-01 NOTE — Assessment & Plan Note (Signed)
04/19/2017: Right lumpectomy: IDC grade 2, 1.3 cm, intermediate grade DCIS, margins negative, 0/2 lymph nodes negative, ER 100%, PR 100%, HER-2 negative ratio 1.15, Ki-67 60%, T1 CN 0 stage IA Oncotype DX recurrence score 26: Distant recurrence rate 17% with hormone therapy alone Adjuvant radiation therapy 06/05/2017 to 07/04/2018  Current treatment: Anastrozole 1 mg daily started August 22, 2017. Anastrozole toxicities: Denies any adverse effects to anastrozole therapy. Encouraged her to get a bone density every 2 years  Breast cancer surveillance: 1.  Mammogram 03/29/2019: No evidence of malignancy, breast density category B 2. breast exam 05/01/2019: Benign  Return to clinic in 1 year for follow-up

## 2019-05-02 ENCOUNTER — Telehealth: Payer: Self-pay | Admitting: Hematology and Oncology

## 2019-05-02 NOTE — Telephone Encounter (Signed)
I left a message regarding schedule  

## 2020-02-13 DIAGNOSIS — E782 Mixed hyperlipidemia: Secondary | ICD-10-CM | POA: Diagnosis not present

## 2020-02-13 DIAGNOSIS — Z23 Encounter for immunization: Secondary | ICD-10-CM | POA: Diagnosis not present

## 2020-02-13 DIAGNOSIS — E039 Hypothyroidism, unspecified: Secondary | ICD-10-CM | POA: Diagnosis not present

## 2020-02-13 DIAGNOSIS — E559 Vitamin D deficiency, unspecified: Secondary | ICD-10-CM | POA: Diagnosis not present

## 2020-02-13 DIAGNOSIS — E2839 Other primary ovarian failure: Secondary | ICD-10-CM | POA: Diagnosis not present

## 2020-02-13 DIAGNOSIS — Z0000 Encounter for general adult medical examination without abnormal findings: Secondary | ICD-10-CM | POA: Diagnosis not present

## 2020-02-13 DIAGNOSIS — Z1211 Encounter for screening for malignant neoplasm of colon: Secondary | ICD-10-CM | POA: Diagnosis not present

## 2020-02-13 DIAGNOSIS — Z853 Personal history of malignant neoplasm of breast: Secondary | ICD-10-CM | POA: Diagnosis not present

## 2020-02-16 DIAGNOSIS — S60561A Insect bite (nonvenomous) of right hand, initial encounter: Secondary | ICD-10-CM | POA: Diagnosis not present

## 2020-02-16 DIAGNOSIS — W57XXXA Bitten or stung by nonvenomous insect and other nonvenomous arthropods, initial encounter: Secondary | ICD-10-CM | POA: Diagnosis not present

## 2020-02-16 DIAGNOSIS — S80869A Insect bite (nonvenomous), unspecified lower leg, initial encounter: Secondary | ICD-10-CM | POA: Diagnosis not present

## 2020-02-17 ENCOUNTER — Other Ambulatory Visit: Payer: Self-pay | Admitting: Adult Health

## 2020-02-17 DIAGNOSIS — Z853 Personal history of malignant neoplasm of breast: Secondary | ICD-10-CM

## 2020-02-17 DIAGNOSIS — Z9889 Other specified postprocedural states: Secondary | ICD-10-CM

## 2020-02-17 DIAGNOSIS — E2839 Other primary ovarian failure: Secondary | ICD-10-CM

## 2020-02-27 ENCOUNTER — Ambulatory Visit (HOSPITAL_COMMUNITY)
Admission: EM | Admit: 2020-02-27 | Discharge: 2020-02-27 | Disposition: A | Discharge status: Home / Self Care | Payer: Medicare PPO | Attending: Urgent Care | Admitting: Urgent Care

## 2020-02-27 ENCOUNTER — Other Ambulatory Visit: Payer: Self-pay

## 2020-02-27 ENCOUNTER — Encounter (HOSPITAL_COMMUNITY): Payer: Self-pay | Admitting: Emergency Medicine

## 2020-02-27 DIAGNOSIS — S51812A Laceration without foreign body of left forearm, initial encounter: Secondary | ICD-10-CM

## 2020-02-27 DIAGNOSIS — M79632 Pain in left forearm: Secondary | ICD-10-CM | POA: Diagnosis not present

## 2020-02-27 MED ORDER — BACITRACIN ZINC 500 UNIT/GM EX OINT: 1.0000 "application " | TOPICAL_OINTMENT | Freq: Two times a day (BID) | CUTANEOUS | 0 refills | Status: DC

## 2020-02-27 NOTE — ED Provider Notes (Signed)
[facility]   MRN: [id number] DOB: 1941-01-23  Subjective:   Erica Frazier is a 79 y.o. female presenting for wound check of left forearm. Suffered avulsion skin tear from screen door ~1 week ago. Has kept her wound clean, covered some of the time. Denies fever, drainage, bleeding, swelling. Is using Neosporin and is nervous about infection.   No current facility-administered medications for this encounter.  Current Outpatient Medications:  .  anastrozole (ARIMIDEX) 1 MG tablet, Take 1 tablet (1 mg total) by mouth daily., Disp: 90 tablet, Rfl: 3 .  levothyroxine (SYNTHROID, LEVOTHROID) 125 MCG tablet, Take 125 mcg by mouth daily before breakfast., Disp: , Rfl:  .  Multiple Vitamin (MULTIVITAMIN WITH MINERALS) TABS tablet, Take 1 tablet by mouth daily., Disp: , Rfl:  .  naproxen sodium (ANAPROX) 220 MG tablet, Take 220 mg by mouth as needed., Disp: , Rfl:    No Known Allergies  Past Medical History:  Diagnosis Date  . Arthritis    osteoarthritis hands  . Cancer ([place]) 03/2017   DCIS right breast  . Hypothyroidism   . Personal history of radiation therapy 2018   Right Breast Cancer     Past Surgical History:  Procedure Laterality Date  . ANKLE HARDWARE REMOVAL Left   . BREAST LUMPECTOMY Right 04/19/2017  . BREAST LUMPECTOMY WITH RADIOACTIVE SEED AND SENTINEL LYMPH NODE BIOPSY Right 04/19/2017   Procedure: RIGHT BREAST LUMPECTOMY WITH RADIOACTIVE SEED AND RIGHT SENTINEL LYMPH NODE BIOPSY;  Surgeon: Erroll Luna, MD;  Location: [facility];  Service: General;  Laterality: Right;  . ORIF ANKLE FRACTURE Left     Family History  Problem Relation Age of Onset  . Hypertension Mother   . Hypertension Father     Social History   Tobacco Use  . Smoking status: Never Smoker  . Smokeless tobacco: Never Used  Substance Use Topics  . Alcohol use: Yes    Comment: social  . Drug use: No    ROS   Objective:   Vitals: BP (!) 171/74 (BP  Location: Right Arm) Comment (BP Location): repositioned  Pulse 64   Temp 97.8 F (36.6 C) (Oral)   Resp 16   SpO2 99%   Physical Exam Constitutional:      General: She is not in acute distress.    Appearance: Normal appearance. She is well-developed. She is not ill-appearing.  HENT:     Head: Normocephalic and atraumatic.     Nose: Nose normal.     Mouth/Throat:     Mouth: Mucous membranes are moist.     Pharynx: Oropharynx is clear.  Eyes:     General: No scleral icterus.    Extraocular Movements: Extraocular movements intact.     Pupils: Pupils are equal, round, and reactive to light.  Cardiovascular:     Rate and Rhythm: Normal rate.  Pulmonary:     Effort: Pulmonary effort is normal.  Skin:    General: Skin is warm and dry.       Neurological:     General: No focal deficit present.     Mental Status: She is alert and oriented to person, place, and time.  Psychiatric:        Mood and Affect: Mood normal.        Behavior: Behavior normal.      Assessment and Plan :   PDMP not reviewed this encounter.  1. Left forearm pain   2. Forearm laceration, left, initial encounter  No signs of infection. Counseled on wound care. Dressing applied in clinic. Counseled patient on potential for adverse effects with medications prescribed/recommended today, ER and return-to-clinic precautions discussed, patient verbalized understanding.    Jaynee Eagles, PA-C 02/28/20 1240

## 2020-02-27 NOTE — ED Triage Notes (Signed)
Caught left forearm on screen door.  Injured Friday night.  When patient went to do a dressing change tonight, patient did not like how it looked.  Patient points out redness around the wound.  Patient thinks it is more swollen, bruising has spread per patient.  Wound looks like a skin tear to left forearm.

## 2020-02-27 NOTE — Discharge Instructions (Addendum)
Keep your wound clean, dry. Cover it at night with non-stick dressing. If you develop redness, swelling, worsening pain, then please let us know as this is a sign of infection.

## 2020-03-31 ENCOUNTER — Ambulatory Visit
Admission: RE | Admit: 2020-03-31 | Discharge: 2020-03-31 | Disposition: A | Discharge status: Home / Self Care | Payer: Medicare Other | Source: Ambulatory Visit | Attending: Adult Health | Admitting: Adult Health

## 2020-03-31 ENCOUNTER — Other Ambulatory Visit: Payer: Self-pay

## 2020-03-31 DIAGNOSIS — R928 Other abnormal and inconclusive findings on diagnostic imaging of breast: Secondary | ICD-10-CM | POA: Diagnosis not present

## 2020-03-31 DIAGNOSIS — Z9889 Other specified postprocedural states: Secondary | ICD-10-CM

## 2020-03-31 DIAGNOSIS — Z853 Personal history of malignant neoplasm of breast: Secondary | ICD-10-CM | POA: Diagnosis not present

## 2020-03-31 HISTORY — DX: Malignant neoplasm of unspecified site of unspecified female breast: C50.919

## 2020-04-29 NOTE — Progress Notes (Signed)
Patient Care Team: [name], MD (Inactive) as PCP - General (Family Medicine) [name], MD as Consulting Physician (Hematology and Oncology) Kyung Rudd, MD as Consulting Physician (Radiation Oncology) Erroll Luna, MD as Consulting Physician (General Surgery) Delice Bison, Charlestine Massed, NP as Nurse Practitioner (Hematology and Oncology)  DIAGNOSIS:    ICD-10-CM   1. Malignant neoplasm of upper-outer quadrant of right breast in female, estrogen receptor positive ([place])  C50.411    Z17.0     SUMMARY OF ONCOLOGIC HISTORY: Oncology History  Malignant neoplasm of upper-outer quadrant of right breast in female, estrogen receptor positive ([place])  04/19/2017 Surgery   Right lumpectomy: IDC grade 2, 1.3 cm, intermediate grade DCIS, margins negative, 0/2 lymph nodes negative, ER 100%, PR 100%, HER-2 negative ratio 1.15, Ki-67 60%, T1 CN 0 stage IA   04/19/2017 Oncotype testing   Oncotype score 26: Risk of distant recurrence 17% with hormonal therapy alone; patient did not want to undergo chemotherapy for a 4% benefit   06/05/2017 - 07/04/2017 Radiation Therapy   Adjuvant radiation therapy   08/2017 -  Anti-estrogen oral therapy   Anastrozole daily     CHIEF COMPLIANT: Follow-up of right breast cancer on anastrozole therapy  INTERVAL HISTORY: Erica Frazier is a 79 y.o. with above-mentioned history of right breast cancer treated with lumpectomy, radiation, and who is currently on anti-estrogen therapy with anastrozole. Mammogram on 03/31/20 showed no evidence of malignancy bilaterally. She presents to the clinic today for annual follow-up.    ALLERGIES:  has No Known Allergies.  MEDICATIONS:  Current Outpatient Medications  Medication Sig Dispense Refill  . anastrozole (ARIMIDEX) 1 MG tablet Take 1 tablet (1 mg total) by mouth daily. 90 tablet 3  . bacitracin ointment Apply 1 application topically 2 (two) times daily. 60 g 0  . levothyroxine (SYNTHROID, LEVOTHROID) 125 MCG  tablet Take 125 mcg by mouth daily before breakfast.    . Multiple Vitamin (MULTIVITAMIN WITH MINERALS) TABS tablet Take 1 tablet by mouth daily.    . naproxen sodium (ANAPROX) 220 MG tablet Take 220 mg by mouth as needed.     No current facility-administered medications for this visit.    PHYSICAL EXAMINATION: ECOG PERFORMANCE STATUS: 1 - Symptomatic but completely ambulatory  Vitals:   04/30/20 0843  BP: 128/69  Pulse: 88  Resp: 17  Temp: (!) 97.1 F (36.2 C)  SpO2: 98%   Filed Weights   04/30/20 0843  Weight: 158 lb 6.4 oz (71.8 kg)    BREAST: No palpable masses or nodules in either right or left breasts. No palpable axillary supraclavicular or infraclavicular adenopathy no breast tenderness or nipple discharge. (exam performed in the presence of a chaperone)  LABORATORY DATA:  I have reviewed the data as listed No flowsheet data found.  No results found for: WBC, HGB, HCT, MCV, PLT, NEUTROABS  ASSESSMENT & PLAN:  Malignant neoplasm of upper-outer quadrant of right breast in female, estrogen receptor positive ([place]) 04/19/2017: Right lumpectomy: IDC grade 2, 1.3 cm, intermediate grade DCIS, margins negative, 0/2 lymph nodes negative, ER 100%, PR 100%, HER-2 negative ratio 1.15, Ki-67 60%, T1 CN 0 stage IA Oncotype DX recurrence score 26: Distant recurrence rate 17% with hormone therapy alone Adjuvant radiation therapy 06/05/2017 to 07/04/2018  Current treatment: Anastrozole 1 mg daily started August 22, 2017. Anastrozole toxicities: Denies any adverse effects to anastrozole therapy. Encouraged her to get a bone density every 2 years this has been scheduled for this month.  Breast cancer surveillance: 1.Mammogram  03/31/2020: No evidence of malignancy, breast density category B 2.breast exam  04/30/2020: Benign  Return to clinic in 1 year for follow-up    No orders of the defined types were placed in this encounter.  The patient has a good understanding of the  overall plan. she agrees with it. she will call with any problems that may develop before the next visit here.  Total time spent: 20 mins including face to face time and time spent for planning, charting and coordination of care  [name], MD 04/30/2020  I, Cloyde Reams Dorshimer, am acting as scribe for Dr. [name].  I have reviewed the above documentation for accuracy and completeness, and I agree with the above.

## 2020-04-30 ENCOUNTER — Telehealth: Payer: Self-pay | Admitting: Hematology and Oncology

## 2020-04-30 ENCOUNTER — Inpatient Hospital Stay: Payer: Medicare PPO | Attending: Hematology and Oncology | Admitting: Hematology and Oncology

## 2020-04-30 ENCOUNTER — Other Ambulatory Visit: Payer: Self-pay

## 2020-04-30 DIAGNOSIS — Z923 Personal history of irradiation: Secondary | ICD-10-CM | POA: Diagnosis not present

## 2020-04-30 DIAGNOSIS — C50411 Malignant neoplasm of upper-outer quadrant of right female breast: Secondary | ICD-10-CM | POA: Diagnosis not present

## 2020-04-30 DIAGNOSIS — Z79811 Long term (current) use of aromatase inhibitors: Secondary | ICD-10-CM | POA: Insufficient documentation

## 2020-04-30 DIAGNOSIS — Z170 Estrogen receptor positive status [ER+]: Secondary | ICD-10-CM

## 2020-04-30 DIAGNOSIS — Z79899 Other long term (current) drug therapy: Secondary | ICD-10-CM | POA: Insufficient documentation

## 2020-04-30 MED ORDER — ANASTROZOLE 1 MG PO TABS: 1.0000 mg | ORAL_TABLET | Freq: Every day | ORAL | 3 refills | Status: DC

## 2020-04-30 NOTE — Telephone Encounter (Signed)
Scheduled appts per 9/9 los. Gave pt a print out of appt calendar.

## 2020-04-30 NOTE — Assessment & Plan Note (Signed)
04/19/2017: Right lumpectomy: IDC grade 2, 1.3 cm, intermediate grade DCIS, margins negative, 0/2 lymph nodes negative, ER 100%, PR 100%, HER-2 negative ratio 1.15, Ki-67 60%, T1 CN 0 stage IA Oncotype DX recurrence score 26: Distant recurrence rate 17% with hormone therapy alone Adjuvant radiation therapy 06/05/2017 to 07/04/2018  Current treatment: Anastrozole 1 mg daily started August 22, 2017. Anastrozole toxicities: Denies any adverse effects to anastrozole therapy. Encouraged her to get a bone density every 2 years this has been scheduled for this month.  Breast cancer surveillance: 1.Mammogram  03/31/2020: No evidence of malignancy, breast density category B 2.breast exam  04/30/2020: Benign  Return to clinic in 1 year for follow-up

## 2020-05-12 ENCOUNTER — Ambulatory Visit
Admission: RE | Admit: 2020-05-12 | Discharge: 2020-05-12 | Disposition: A | Discharge status: Home / Self Care | Payer: Medicare PPO | Source: Ambulatory Visit | Attending: Adult Health | Admitting: Adult Health

## 2020-05-12 ENCOUNTER — Other Ambulatory Visit: Payer: Self-pay

## 2020-05-12 DIAGNOSIS — Z780 Asymptomatic menopausal state: Secondary | ICD-10-CM | POA: Diagnosis not present

## 2020-05-12 DIAGNOSIS — E2839 Other primary ovarian failure: Secondary | ICD-10-CM

## 2020-05-13 ENCOUNTER — Telehealth: Payer: Self-pay

## 2020-05-13 DIAGNOSIS — L905 Scar conditions and fibrosis of skin: Secondary | ICD-10-CM | POA: Diagnosis not present

## 2020-05-13 DIAGNOSIS — D485 Neoplasm of uncertain behavior of skin: Secondary | ICD-10-CM | POA: Diagnosis not present

## 2020-05-13 DIAGNOSIS — L821 Other seborrheic keratosis: Secondary | ICD-10-CM | POA: Diagnosis not present

## 2020-05-13 DIAGNOSIS — Z85828 Personal history of other malignant neoplasm of skin: Secondary | ICD-10-CM | POA: Diagnosis not present

## 2020-05-13 DIAGNOSIS — L570 Actinic keratosis: Secondary | ICD-10-CM | POA: Diagnosis not present

## 2020-05-13 DIAGNOSIS — D225 Melanocytic nevi of trunk: Secondary | ICD-10-CM | POA: Diagnosis not present

## 2020-05-13 DIAGNOSIS — D1801 Hemangioma of skin and subcutaneous tissue: Secondary | ICD-10-CM | POA: Diagnosis not present

## 2020-05-13 DIAGNOSIS — L814 Other melanin hyperpigmentation: Secondary | ICD-10-CM | POA: Diagnosis not present

## 2020-05-13 NOTE — Telephone Encounter (Signed)
-----   Message from Gardenia Phlegm, NP sent at 05/12/2020  4:33 PM EDT ----- Bone density normal, please notify patient ----- Message ----- From: Interface, Rad Results In Sent: 05/12/2020  12:13 PM EDT To: Gardenia Phlegm, NP

## 2020-05-13 NOTE — Telephone Encounter (Signed)
Called pt to make aware of below information. Verbalized thanks and understanding. Pt understands to call with any questions/concerns.

## 2020-06-24 DIAGNOSIS — L905 Scar conditions and fibrosis of skin: Secondary | ICD-10-CM | POA: Diagnosis not present

## 2020-06-24 DIAGNOSIS — L820 Inflamed seborrheic keratosis: Secondary | ICD-10-CM | POA: Diagnosis not present

## 2020-06-24 DIAGNOSIS — D485 Neoplasm of uncertain behavior of skin: Secondary | ICD-10-CM | POA: Diagnosis not present

## 2020-08-25 DIAGNOSIS — H40013 Open angle with borderline findings, low risk, bilateral: Secondary | ICD-10-CM | POA: Diagnosis not present

## 2020-08-25 DIAGNOSIS — D3122 Benign neoplasm of left retina: Secondary | ICD-10-CM | POA: Diagnosis not present

## 2020-09-29 DIAGNOSIS — H2513 Age-related nuclear cataract, bilateral: Secondary | ICD-10-CM | POA: Diagnosis not present

## 2020-09-29 DIAGNOSIS — H18413 Arcus senilis, bilateral: Secondary | ICD-10-CM | POA: Diagnosis not present

## 2020-09-29 DIAGNOSIS — H2512 Age-related nuclear cataract, left eye: Secondary | ICD-10-CM | POA: Diagnosis not present

## 2020-09-29 DIAGNOSIS — H40013 Open angle with borderline findings, low risk, bilateral: Secondary | ICD-10-CM | POA: Diagnosis not present

## 2020-09-29 DIAGNOSIS — H25043 Posterior subcapsular polar age-related cataract, bilateral: Secondary | ICD-10-CM | POA: Diagnosis not present

## 2020-09-29 DIAGNOSIS — H25013 Cortical age-related cataract, bilateral: Secondary | ICD-10-CM | POA: Diagnosis not present

## 2020-09-29 DIAGNOSIS — H35373 Puckering of macula, bilateral: Secondary | ICD-10-CM | POA: Diagnosis not present

## 2020-10-09 DIAGNOSIS — H2512 Age-related nuclear cataract, left eye: Secondary | ICD-10-CM | POA: Diagnosis not present

## 2020-10-09 DIAGNOSIS — H2511 Age-related nuclear cataract, right eye: Secondary | ICD-10-CM | POA: Diagnosis not present

## 2020-10-09 DIAGNOSIS — H2513 Age-related nuclear cataract, bilateral: Secondary | ICD-10-CM | POA: Diagnosis not present

## 2020-11-09 DIAGNOSIS — H2511 Age-related nuclear cataract, right eye: Secondary | ICD-10-CM | POA: Diagnosis not present

## 2020-11-09 DIAGNOSIS — H2513 Age-related nuclear cataract, bilateral: Secondary | ICD-10-CM | POA: Diagnosis not present

## 2021-02-19 ENCOUNTER — Other Ambulatory Visit: Payer: Self-pay | Admitting: Adult Health

## 2021-02-19 DIAGNOSIS — Z853 Personal history of malignant neoplasm of breast: Secondary | ICD-10-CM

## 2021-03-04 DIAGNOSIS — Z0000 Encounter for general adult medical examination without abnormal findings: Secondary | ICD-10-CM | POA: Diagnosis not present

## 2021-03-04 DIAGNOSIS — E039 Hypothyroidism, unspecified: Secondary | ICD-10-CM | POA: Diagnosis not present

## 2021-03-04 DIAGNOSIS — E782 Mixed hyperlipidemia: Secondary | ICD-10-CM | POA: Diagnosis not present

## 2021-03-04 DIAGNOSIS — E559 Vitamin D deficiency, unspecified: Secondary | ICD-10-CM | POA: Diagnosis not present

## 2021-03-09 DIAGNOSIS — E559 Vitamin D deficiency, unspecified: Secondary | ICD-10-CM | POA: Diagnosis not present

## 2021-03-09 DIAGNOSIS — E039 Hypothyroidism, unspecified: Secondary | ICD-10-CM | POA: Diagnosis not present

## 2021-03-09 DIAGNOSIS — Z0000 Encounter for general adult medical examination without abnormal findings: Secondary | ICD-10-CM | POA: Diagnosis not present

## 2021-03-09 DIAGNOSIS — E782 Mixed hyperlipidemia: Secondary | ICD-10-CM | POA: Diagnosis not present

## 2021-03-09 DIAGNOSIS — Z853 Personal history of malignant neoplasm of breast: Secondary | ICD-10-CM | POA: Diagnosis not present

## 2021-04-01 ENCOUNTER — Ambulatory Visit
Admission: RE | Admit: 2021-04-01 | Discharge: 2021-04-01 | Disposition: A | Discharge status: Home / Self Care | Payer: Medicare PPO | Source: Ambulatory Visit | Attending: Adult Health | Admitting: Adult Health

## 2021-04-01 ENCOUNTER — Other Ambulatory Visit: Payer: Self-pay

## 2021-04-01 DIAGNOSIS — Z853 Personal history of malignant neoplasm of breast: Secondary | ICD-10-CM

## 2021-04-01 DIAGNOSIS — R922 Inconclusive mammogram: Secondary | ICD-10-CM | POA: Diagnosis not present

## 2021-05-04 NOTE — Progress Notes (Signed)
Patient Care Team: [name], MD (Inactive) as PCP - General (Family Medicine) [name], MD as Consulting Physician (Hematology and Oncology) Kyung Rudd, MD as Consulting Physician (Radiation Oncology) Erroll Luna, MD as Consulting Physician (General Surgery) Delice Bison, Charlestine Massed, NP as Nurse Practitioner (Hematology and Oncology)  DIAGNOSIS:    ICD-10-CM   1. Malignant neoplasm of upper-outer quadrant of right breast in female, estrogen receptor positive ([place])  C50.411    Z17.0       SUMMARY OF ONCOLOGIC HISTORY: Oncology History  Malignant neoplasm of upper-outer quadrant of right breast in female, estrogen receptor positive ([place])  04/19/2017 Surgery   Right lumpectomy: IDC grade 2, 1.3 cm, intermediate grade DCIS, margins negative, 0/2 lymph nodes negative, ER 100%, PR 100%, HER-2 negative ratio 1.15, Ki-67 60%, T1 CN 0 stage IA   04/19/2017 Oncotype testing   Oncotype score 26: Risk of distant recurrence 17% with hormonal therapy alone; patient did not want to undergo chemotherapy for a 4% benefit   06/05/2017 - 07/04/2017 Radiation Therapy   Adjuvant radiation therapy   08/2017 -  Anti-estrogen oral therapy   Anastrozole daily     CHIEF COMPLIANT: Follow-up of right breast cancer on anastrozole therapy  INTERVAL HISTORY: Erica Frazier is a 80 y.o. with above-mentioned history of right breast cancer treated with lumpectomy, radiation, and who is currently on anti-estrogen therapy with anastrozole. Mammogram on 04/01/21 showed no evidence of malignancy bilaterally. She presents to the clinic today for annual follow-up.  Other than mild hot flashes she is tolerating anastrozole extremely well.  Denies any arthralgias.  She does have mild arthritis in her hands.  ALLERGIES:  has No Known Allergies.  MEDICATIONS:  Current Outpatient Medications  Medication Sig Dispense Refill   anastrozole (ARIMIDEX) 1 MG tablet Take 1 tablet (1 mg total) by mouth  daily. 90 tablet 3   levothyroxine (SYNTHROID, LEVOTHROID) 125 MCG tablet Take 125 mcg by mouth daily before breakfast.     Multiple Vitamin (MULTIVITAMIN WITH MINERALS) TABS tablet Take 1 tablet by mouth daily.     naproxen sodium (ANAPROX) 220 MG tablet Take 220 mg by mouth as needed.     No current facility-administered medications for this visit.    PHYSICAL EXAMINATION: ECOG PERFORMANCE STATUS: 1 - Symptomatic but completely ambulatory  Vitals:   05/05/21 0836  BP: 137/70  Pulse: 85  Resp: 19  Temp: 97.8 F (36.6 C)  SpO2: 99%   Filed Weights   05/05/21 0836  Weight: 155 lb 9.6 oz (70.6 kg)    BREAST: No palpable masses or nodules in either right or left breasts. No palpable axillary supraclavicular or infraclavicular adenopathy no breast tenderness or nipple discharge. (exam performed in the presence of a chaperone)  LABORATORY DATA:  I have reviewed the data as listed No flowsheet data found.  No results found for: WBC, HGB, HCT, MCV, PLT, NEUTROABS  ASSESSMENT & PLAN:  Malignant neoplasm of upper-outer quadrant of right breast in female, estrogen receptor positive ([place]) 04/19/2017: Right lumpectomy: IDC grade 2, 1.3 cm, intermediate grade DCIS, margins negative, 0/2 lymph nodes negative, ER 100%, PR 100%, HER-2 negative ratio 1.15, Ki-67 60%, T1 CN 0 stage IA Oncotype DX recurrence score 26: Distant recurrence rate 17% with hormone therapy alone Adjuvant radiation therapy 06/05/2017 to 07/04/2018   Current treatment: Anastrozole 1 mg daily started August 22, 2017. Anastrozole toxicities: Mild hot flashes Bone density 05/12/2020: T score -0.9: Normal.   Breast cancer surveillance: 1.  Mammogram  04/01/2021: No evidence of malignancy, breast density category B 2. breast exam  05/05/2021: Benign   Return to clinic in 1 year for follow-up    No orders of the defined types were placed in this encounter.  The patient has a good understanding of the overall plan.  she agrees with it. she will call with any problems that may develop before the next visit here.  Total time spent: 20 mins including face to face time and time spent for planning, charting and coordination of care  Rulon Eisenmenger, MD, MPH 05/05/2021  I, Thana Ates, am acting as scribe for Dr. [name].  I have reviewed the above documentation for accuracy and completeness, and I agree with the above.

## 2021-05-05 ENCOUNTER — Inpatient Hospital Stay: Payer: Medicare PPO | Attending: Hematology and Oncology | Admitting: Hematology and Oncology

## 2021-05-05 ENCOUNTER — Other Ambulatory Visit: Payer: Self-pay

## 2021-05-05 DIAGNOSIS — Z79899 Other long term (current) drug therapy: Secondary | ICD-10-CM | POA: Diagnosis not present

## 2021-05-05 DIAGNOSIS — Z79811 Long term (current) use of aromatase inhibitors: Secondary | ICD-10-CM | POA: Diagnosis not present

## 2021-05-05 DIAGNOSIS — Z170 Estrogen receptor positive status [ER+]: Secondary | ICD-10-CM | POA: Insufficient documentation

## 2021-05-05 DIAGNOSIS — C50411 Malignant neoplasm of upper-outer quadrant of right female breast: Secondary | ICD-10-CM | POA: Insufficient documentation

## 2021-05-05 DIAGNOSIS — Z923 Personal history of irradiation: Secondary | ICD-10-CM | POA: Insufficient documentation

## 2021-05-05 DIAGNOSIS — N951 Menopausal and female climacteric states: Secondary | ICD-10-CM | POA: Diagnosis not present

## 2021-05-05 MED ORDER — ANASTROZOLE 1 MG PO TABS: 1.0000 mg | ORAL_TABLET | Freq: Every day | ORAL | 3 refills | Status: DC

## 2021-05-05 NOTE — Assessment & Plan Note (Signed)
04/19/2017: Right lumpectomy: IDC grade 2, 1.3 cm, intermediate grade DCIS, margins negative, 0/2 lymph nodes negative, ER 100%, PR 100%, HER-2 negative ratio 1.15, Ki-67 60%, T1 CN 0 stage IA Oncotype DX recurrence score 26: Distant recurrence rate 17% with hormone therapy alone Adjuvant radiation therapy 06/05/2017 to 07/04/2018  Current treatment:Anastrozole 1 mg daily started August 22, 2017. Anastrozole toxicities:Denies any adverse effects to anastrozole therapy. Bone density 05/12/2020: T score -0.9: Normal.  Breast cancer surveillance: 1.Mammogram 04/01/2021: No evidence of malignancy, breast density category B 2.breast exam 05/05/2021: Benign  Return to clinic in 1 year for follow-up

## 2021-05-18 DIAGNOSIS — L820 Inflamed seborrheic keratosis: Secondary | ICD-10-CM | POA: Diagnosis not present

## 2021-05-18 DIAGNOSIS — Z08 Encounter for follow-up examination after completed treatment for malignant neoplasm: Secondary | ICD-10-CM | POA: Diagnosis not present

## 2021-05-18 DIAGNOSIS — L814 Other melanin hyperpigmentation: Secondary | ICD-10-CM | POA: Diagnosis not present

## 2021-05-18 DIAGNOSIS — L821 Other seborrheic keratosis: Secondary | ICD-10-CM | POA: Diagnosis not present

## 2021-05-18 DIAGNOSIS — Z872 Personal history of diseases of the skin and subcutaneous tissue: Secondary | ICD-10-CM | POA: Diagnosis not present

## 2021-05-18 DIAGNOSIS — L538 Other specified erythematous conditions: Secondary | ICD-10-CM | POA: Diagnosis not present

## 2021-05-18 DIAGNOSIS — D225 Melanocytic nevi of trunk: Secondary | ICD-10-CM | POA: Diagnosis not present

## 2021-05-18 DIAGNOSIS — Z09 Encounter for follow-up examination after completed treatment for conditions other than malignant neoplasm: Secondary | ICD-10-CM | POA: Diagnosis not present

## 2021-05-18 DIAGNOSIS — Z85828 Personal history of other malignant neoplasm of skin: Secondary | ICD-10-CM | POA: Diagnosis not present

## 2021-07-29 DIAGNOSIS — Z86010 Personal history of colonic polyps: Secondary | ICD-10-CM | POA: Diagnosis not present

## 2021-07-29 DIAGNOSIS — K648 Other hemorrhoids: Secondary | ICD-10-CM | POA: Diagnosis not present

## 2021-10-20 DIAGNOSIS — L03114 Cellulitis of left upper limb: Secondary | ICD-10-CM | POA: Diagnosis not present

## 2022-02-21 ENCOUNTER — Other Ambulatory Visit: Payer: Self-pay | Admitting: Hematology and Oncology

## 2022-02-21 DIAGNOSIS — Z1231 Encounter for screening mammogram for malignant neoplasm of breast: Secondary | ICD-10-CM

## 2022-03-16 DIAGNOSIS — E782 Mixed hyperlipidemia: Secondary | ICD-10-CM | POA: Diagnosis not present

## 2022-03-16 DIAGNOSIS — Z853 Personal history of malignant neoplasm of breast: Secondary | ICD-10-CM | POA: Diagnosis not present

## 2022-03-16 DIAGNOSIS — Z0000 Encounter for general adult medical examination without abnormal findings: Secondary | ICD-10-CM | POA: Diagnosis not present

## 2022-03-16 DIAGNOSIS — E039 Hypothyroidism, unspecified: Secondary | ICD-10-CM | POA: Diagnosis not present

## 2022-03-16 DIAGNOSIS — E559 Vitamin D deficiency, unspecified: Secondary | ICD-10-CM | POA: Diagnosis not present

## 2022-03-21 DIAGNOSIS — E559 Vitamin D deficiency, unspecified: Secondary | ICD-10-CM | POA: Diagnosis not present

## 2022-03-21 DIAGNOSIS — E782 Mixed hyperlipidemia: Secondary | ICD-10-CM | POA: Diagnosis not present

## 2022-03-21 DIAGNOSIS — Z0000 Encounter for general adult medical examination without abnormal findings: Secondary | ICD-10-CM | POA: Diagnosis not present

## 2022-03-21 DIAGNOSIS — Z853 Personal history of malignant neoplasm of breast: Secondary | ICD-10-CM | POA: Diagnosis not present

## 2022-03-21 DIAGNOSIS — E039 Hypothyroidism, unspecified: Secondary | ICD-10-CM | POA: Diagnosis not present

## 2022-04-04 ENCOUNTER — Ambulatory Visit
Admission: RE | Admit: 2022-04-04 | Discharge: 2022-04-04 | Disposition: A | Discharge status: Home / Self Care | Payer: Medicare PPO | Source: Ambulatory Visit | Attending: Hematology and Oncology | Admitting: Hematology and Oncology

## 2022-04-04 DIAGNOSIS — Z1231 Encounter for screening mammogram for malignant neoplasm of breast: Secondary | ICD-10-CM

## 2022-05-03 NOTE — Progress Notes (Signed)
Patient Care Team: [name], MD (Inactive) as PCP - General (Family Medicine) [name], MD as Consulting Physician (Hematology and Oncology) Kyung Rudd, MD as Consulting Physician (Radiation Oncology) Erroll Luna, MD as Consulting Physician (General Surgery) Delice Bison Charlestine Massed, NP as Nurse Practitioner (Hematology and Oncology)  DIAGNOSIS:  Encounter Diagnosis  Name Primary?   Malignant neoplasm of upper-outer quadrant of right breast in female, estrogen receptor positive ([place])     SUMMARY OF ONCOLOGIC HISTORY: Oncology History  Malignant neoplasm of upper-outer quadrant of right breast in female, estrogen receptor positive ([place])  04/19/2017 Surgery   Right lumpectomy: IDC grade 2, 1.3 cm, intermediate grade DCIS, margins negative, 0/2 lymph nodes negative, ER 100%, PR 100%, HER-2 negative ratio 1.15, Ki-67 60%, T1 CN 0 stage IA   04/19/2017 Oncotype testing   Oncotype score 26: Risk of distant recurrence 17% with hormonal therapy alone; patient did not want to undergo chemotherapy for a 4% benefit   06/05/2017 - 07/04/2017 Radiation Therapy   Adjuvant radiation therapy   08/2017 -  Anti-estrogen oral therapy   Anastrozole daily     CHIEF COMPLIANT: Follow-up of right breast cancer on anastrozole therapy    INTERVAL HISTORY: Erica Frazier is a 81 y.o. with above-mentioned history of right breast cancer treated with lumpectomy, radiation, and who is currently on anti-estrogen therapy with anastrozole. She presents to the clinic today for a follow-up. She states that her health has been in good shape. She states that she is getting ready to start traveling. She denies any side effects from the anastrozole. She is tolerating it extremely well.    ALLERGIES:  has No Known Allergies.  MEDICATIONS:  Current Outpatient Medications  Medication Sig Dispense Refill   anastrozole (ARIMIDEX) 1 MG tablet Take 1 tablet (1 mg total) by mouth daily. 90 tablet 3    levothyroxine (SYNTHROID, LEVOTHROID) 125 MCG tablet Take 125 mcg by mouth daily before breakfast.     Multiple Vitamin (MULTIVITAMIN WITH MINERALS) TABS tablet Take 1 tablet by mouth daily.     naproxen sodium (ANAPROX) 220 MG tablet Take 220 mg by mouth as needed.     No current facility-administered medications for this visit.    PHYSICAL EXAMINATION: ECOG PERFORMANCE STATUS: 1 - Symptomatic but completely ambulatory  Vitals:   05/09/22 0855  BP: 129/68  Pulse: 74  Resp: 18  Temp: 97.7 F (36.5 C)  SpO2: 96%   Filed Weights   05/09/22 0855  Weight: 153 lb 14.4 oz (69.8 kg)    BREAST: No palpable masses or nodules in either right or left breasts. No palpable axillary supraclavicular or infraclavicular adenopathy no breast tenderness or nipple discharge. (exam performed in the presence of a chaperone)   ASSESSMENT & PLAN:  Malignant neoplasm of upper-outer quadrant of right breast in female, estrogen receptor positive ([place]) 04/19/2017: Right lumpectomy: IDC grade 2, 1.3 cm, intermediate grade DCIS, margins negative, 0/2 lymph nodes negative, ER 100%, PR 100%, HER-2 negative ratio 1.15, Ki-67 60%, T1 CN 0 stage IA Oncotype DX recurrence score 26: Distant recurrence rate 17% with hormone therapy alone Adjuvant radiation therapy 06/05/2017 to 07/04/2018   Current treatment: Anastrozole 1 mg daily started August 22, 2017. Recommended duration: 7 years Anastrozole toxicities: Mild hot flashes Bone density 05/12/2020: T score -0.9: Normal.   Breast cancer surveillance: 1.  Mammogram 04/04/2022: No evidence of malignancy, breast density category B 2. breast exam 05/09/2022: Benign   She is planning to go to [place] next year  in April on a painting trip. 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 Return to clinic in 1 year for follow-up    No orders of the defined types were placed in this encounter.  The  patient has a good understanding of the overall plan. she agrees with it. she will call with any problems that may develop before the next visit here. Total time spent: 30 mins including face to face time and time spent for planning, charting and co-ordination of care   [name], MD 05/09/22    I Gardiner Coins am scribing for Dr. [name]  I have reviewed the above documentation for accuracy and completeness, and I agree with the above.

## 2022-05-09 ENCOUNTER — Other Ambulatory Visit: Payer: Self-pay

## 2022-05-09 ENCOUNTER — Inpatient Hospital Stay: Payer: Medicare PPO | Attending: Hematology and Oncology | Admitting: Hematology and Oncology

## 2022-05-09 DIAGNOSIS — Z79811 Long term (current) use of aromatase inhibitors: Secondary | ICD-10-CM | POA: Diagnosis not present

## 2022-05-09 DIAGNOSIS — Z923 Personal history of irradiation: Secondary | ICD-10-CM | POA: Insufficient documentation

## 2022-05-09 DIAGNOSIS — C50411 Malignant neoplasm of upper-outer quadrant of right female breast: Secondary | ICD-10-CM | POA: Diagnosis not present

## 2022-05-09 DIAGNOSIS — Z170 Estrogen receptor positive status [ER+]: Secondary | ICD-10-CM | POA: Diagnosis not present

## 2022-05-09 MED ORDER — ANASTROZOLE 1 MG PO TABS: 1.0000 mg | ORAL_TABLET | Freq: Every day | ORAL | 3 refills | Status: DC

## 2022-05-09 NOTE — Assessment & Plan Note (Addendum)
04/19/2017: Right lumpectomy: IDC grade 2, 1.3 cm, intermediate grade DCIS, margins negative, 0/2 lymph nodes negative, ER 100%, PR 100%, HER-2 negative ratio 1.15, Ki-67 60%, T1 CN 0 stage IA Oncotype DX recurrence score 26: Distant recurrence rate 17% with hormone therapy alone Adjuvant radiation therapy 06/05/2017 to 07/04/2018  Current treatment:Anastrozole 1 mg daily started August 22, 2017. Recommended duration: 7 years Anastrozole toxicities:Mild hot flashes Bone density 05/12/2020: T score -0.9: Normal.  Breast cancer surveillance: 1.Mammogram8/14/2023: No evidence of malignancy, breast density category B 2.breast exam9/18/2023: Benign  She is planning to go to [place] next year in April on a painting trip. 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 Return to clinic in 1 year for follow-up

## 2022-05-17 DIAGNOSIS — L700 Acne vulgaris: Secondary | ICD-10-CM | POA: Diagnosis not present

## 2022-05-17 DIAGNOSIS — Z85828 Personal history of other malignant neoplasm of skin: Secondary | ICD-10-CM | POA: Diagnosis not present

## 2022-05-17 DIAGNOSIS — Z872 Personal history of diseases of the skin and subcutaneous tissue: Secondary | ICD-10-CM | POA: Diagnosis not present

## 2022-05-17 DIAGNOSIS — D225 Melanocytic nevi of trunk: Secondary | ICD-10-CM | POA: Diagnosis not present

## 2022-05-17 DIAGNOSIS — D492 Neoplasm of unspecified behavior of bone, soft tissue, and skin: Secondary | ICD-10-CM | POA: Diagnosis not present

## 2022-05-17 DIAGNOSIS — L814 Other melanin hyperpigmentation: Secondary | ICD-10-CM | POA: Diagnosis not present

## 2022-05-17 DIAGNOSIS — L570 Actinic keratosis: Secondary | ICD-10-CM | POA: Diagnosis not present

## 2022-05-17 DIAGNOSIS — Z08 Encounter for follow-up examination after completed treatment for malignant neoplasm: Secondary | ICD-10-CM | POA: Diagnosis not present

## 2022-05-17 DIAGNOSIS — L821 Other seborrheic keratosis: Secondary | ICD-10-CM | POA: Diagnosis not present

## 2022-05-17 DIAGNOSIS — Z09 Encounter for follow-up examination after completed treatment for conditions other than malignant neoplasm: Secondary | ICD-10-CM | POA: Diagnosis not present

## 2022-05-24 DIAGNOSIS — L570 Actinic keratosis: Secondary | ICD-10-CM | POA: Diagnosis not present

## 2022-06-20 DIAGNOSIS — Z48817 Encounter for surgical aftercare following surgery on the skin and subcutaneous tissue: Secondary | ICD-10-CM | POA: Diagnosis not present

## 2022-06-20 DIAGNOSIS — L570 Actinic keratosis: Secondary | ICD-10-CM | POA: Diagnosis not present

## 2022-06-20 DIAGNOSIS — L578 Other skin changes due to chronic exposure to nonionizing radiation: Secondary | ICD-10-CM | POA: Diagnosis not present

## 2022-12-01 IMAGING — MG DIGITAL DIAGNOSTIC BILAT W/ TOMO W/ CAD
6 of 10 series · 6 of 26 positions shown · non-contrast
Comparison: Previous exam(s).

CLINICAL DATA: 80-year-old female presenting for routine annual
surveillance status post right breast lumpectomy in 5002.

EXAM:
DIGITAL DIAGNOSTIC BILATERAL MAMMOGRAM WITH TOMOSYNTHESIS AND CAD
TECHNIQUE: Bilateral digital diagnostic mammography and breast tomosynthesis
was performed. The images were evaluated with computer-aided
detection.

[R MLO]
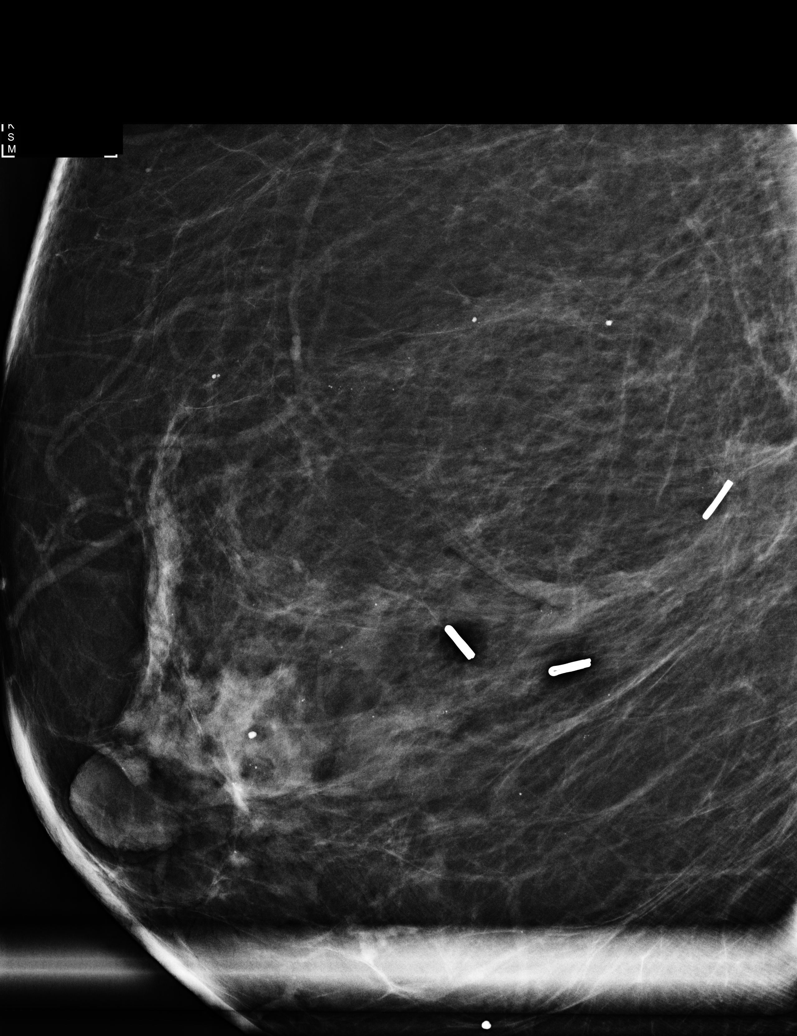

[R CC]
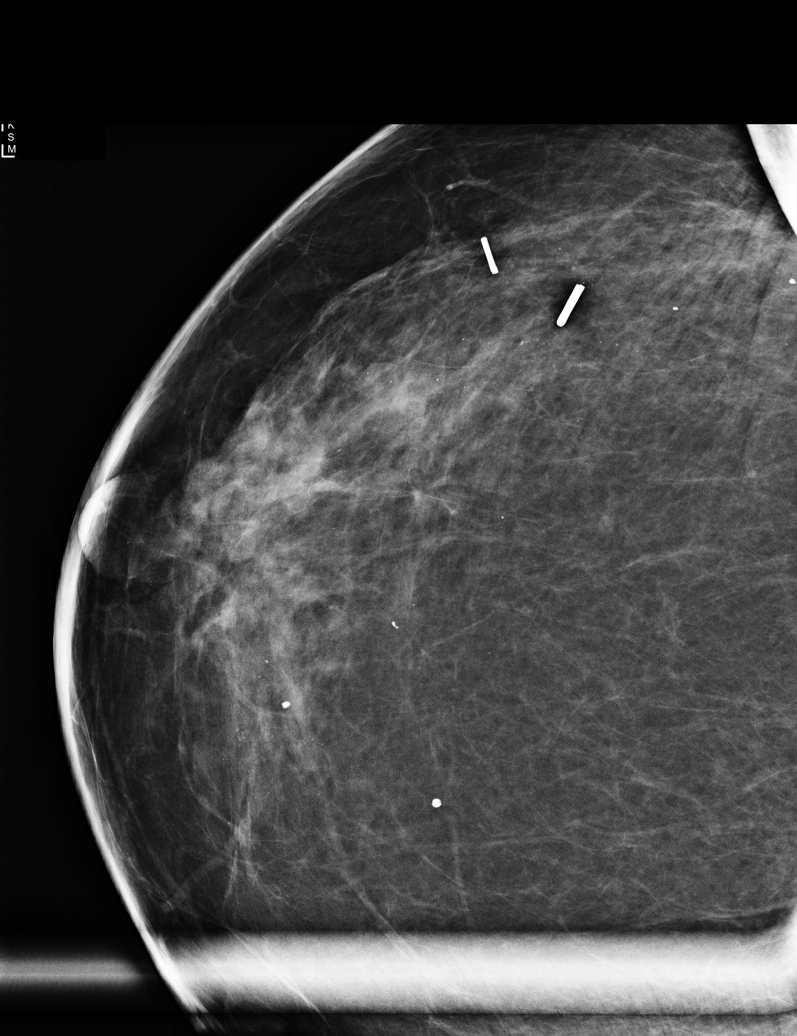

[L CC synth-2D]
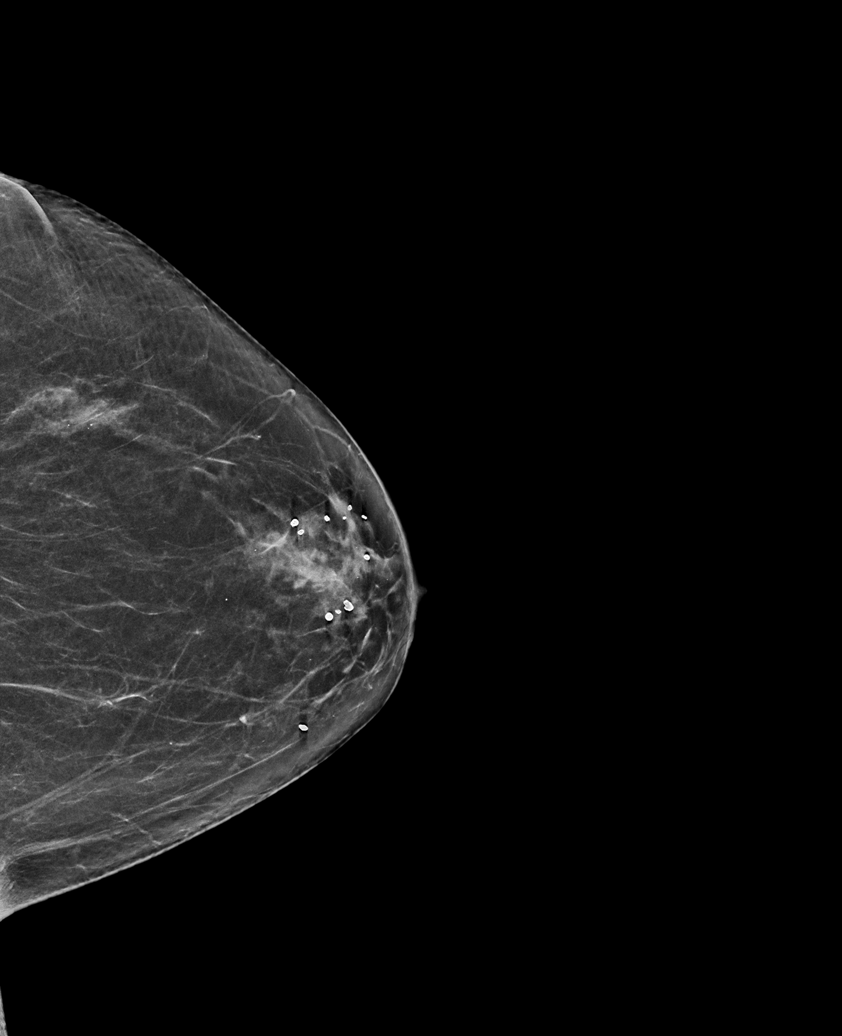

[R MLO synth-2D]
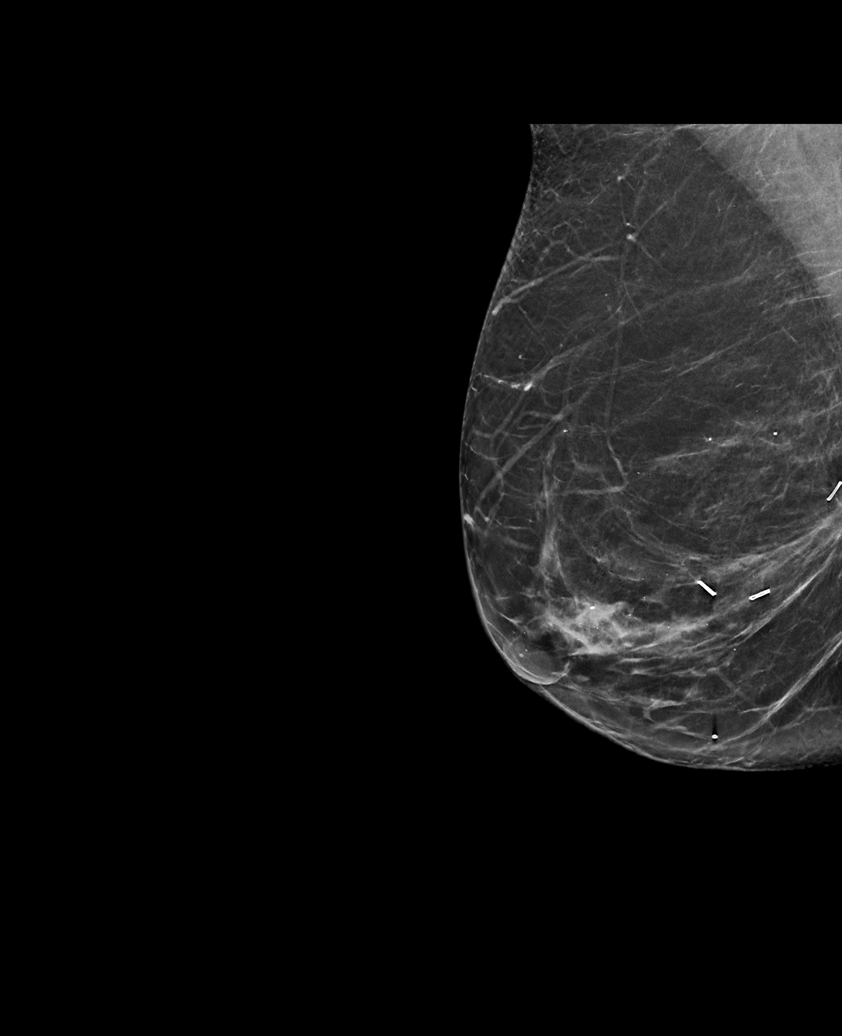

[R CC synth-2D]
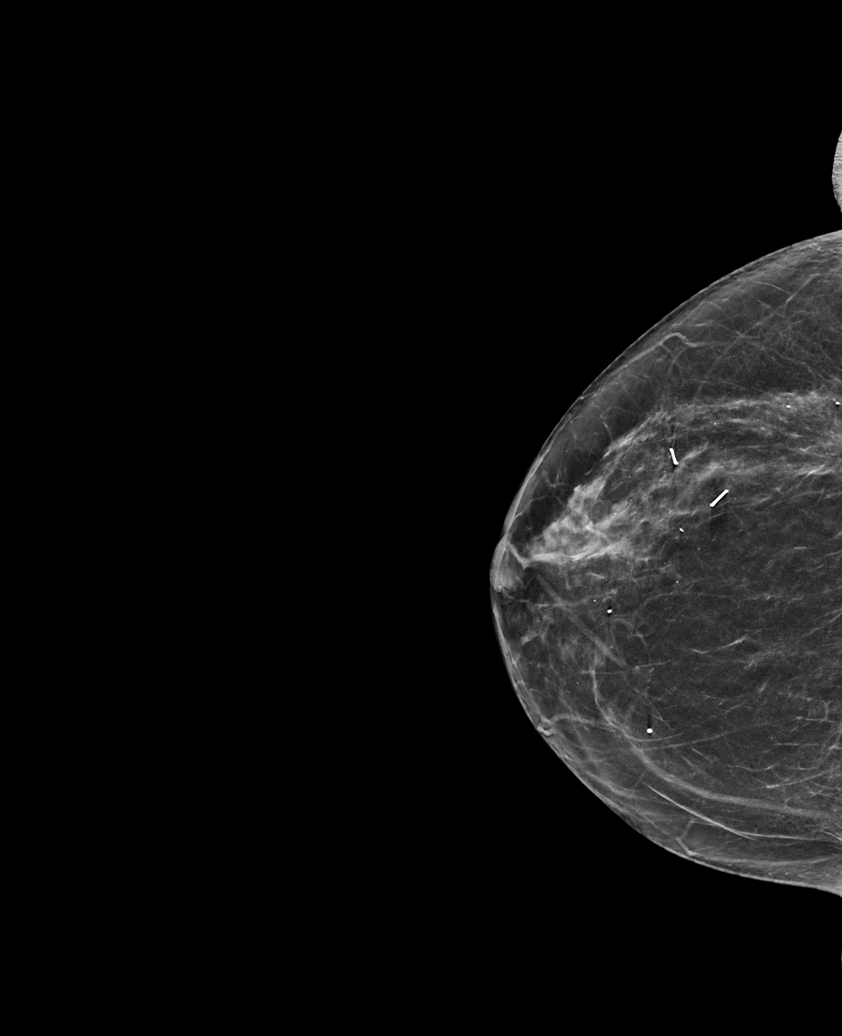

[L MLO synth-2D]
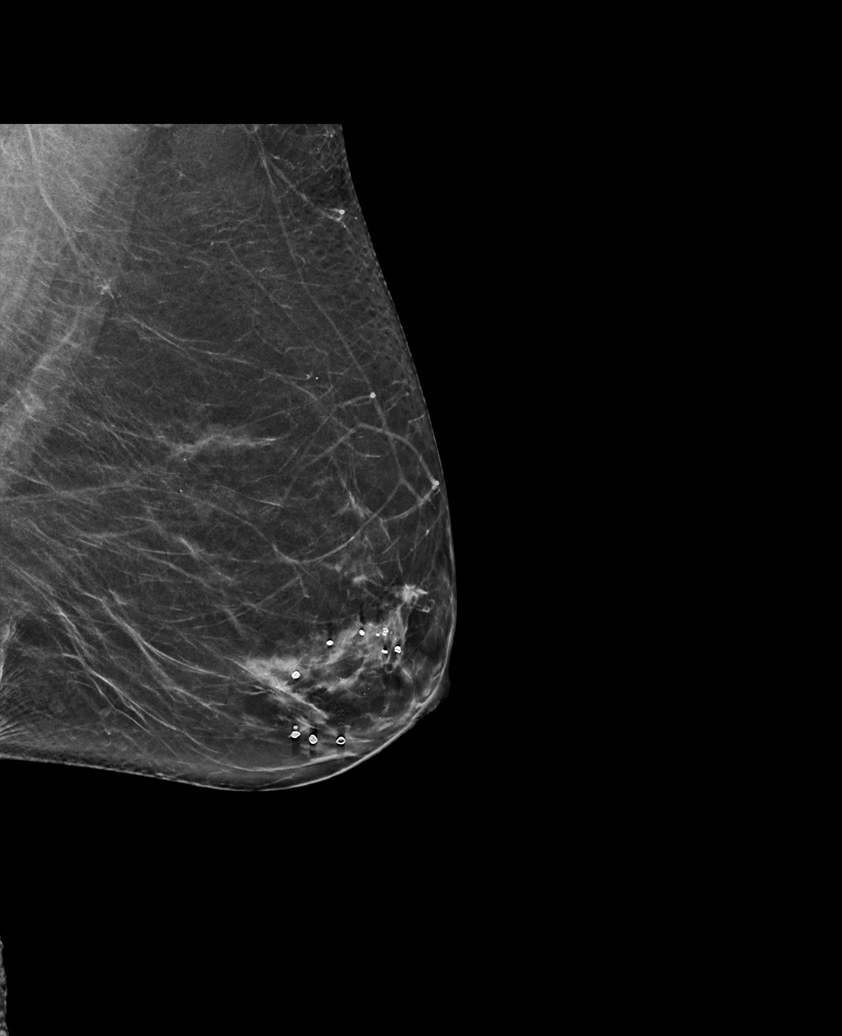

[6 of 26 positions shown; findings below may reference images not displayed]

ACR Breast Density Category c: The breast tissue is heterogeneously
dense, which may obscure small masses.
FINDINGS: Right breast lumpectomy site is stable. No suspicious
calcifications, masses or areas of distortion are seen in the
bilateral breasts.
IMPRESSION: Stable right breast lumpectomy site. No mammographic evidence of
malignancy in the bilateral breasts.

RECOMMENDATION:
Per protocol, as the patient is now 2 or more years status post
lumpectomy, she may return to annual screening mammography in 1
year. However, given the history of breast cancer, the patient
remains eligible for annual diagnostic mammography if preferred. At
this time, the patient states she prefers to continue with
diagnostic imaging.

I have discussed the findings and recommendations with the patient.
If applicable, a reminder letter will be sent to the patient
regarding the next appointment.

BI-RADS CATEGORY  2: Benign.

## 2023-02-24 ENCOUNTER — Other Ambulatory Visit: Payer: Self-pay | Admitting: Hematology and Oncology

## 2023-02-24 DIAGNOSIS — Z1231 Encounter for screening mammogram for malignant neoplasm of breast: Secondary | ICD-10-CM

## 2023-03-24 DIAGNOSIS — E039 Hypothyroidism, unspecified: Secondary | ICD-10-CM | POA: Diagnosis not present

## 2023-03-24 DIAGNOSIS — Z0000 Encounter for general adult medical examination without abnormal findings: Secondary | ICD-10-CM | POA: Diagnosis not present

## 2023-03-24 DIAGNOSIS — Z853 Personal history of malignant neoplasm of breast: Secondary | ICD-10-CM | POA: Diagnosis not present

## 2023-03-24 DIAGNOSIS — E782 Mixed hyperlipidemia: Secondary | ICD-10-CM | POA: Diagnosis not present

## 2023-03-24 DIAGNOSIS — E559 Vitamin D deficiency, unspecified: Secondary | ICD-10-CM | POA: Diagnosis not present

## 2023-04-07 ENCOUNTER — Ambulatory Visit: Admission: RE | Admit: 2023-04-07 | Payer: Medicare PPO | Source: Ambulatory Visit

## 2023-04-07 DIAGNOSIS — Z1231 Encounter for screening mammogram for malignant neoplasm of breast: Secondary | ICD-10-CM | POA: Diagnosis not present

## 2023-05-10 ENCOUNTER — Inpatient Hospital Stay: Payer: Medicare PPO | Attending: Hematology and Oncology | Admitting: Hematology and Oncology

## 2023-05-10 VITALS — BP 125/63 | HR 87 | Temp 97.5°F | Resp 18 | Ht 66.0 in | Wt 155.3 lb

## 2023-05-10 DIAGNOSIS — Z79811 Long term (current) use of aromatase inhibitors: Secondary | ICD-10-CM | POA: Insufficient documentation

## 2023-05-10 DIAGNOSIS — Z780 Asymptomatic menopausal state: Secondary | ICD-10-CM

## 2023-05-10 DIAGNOSIS — Z170 Estrogen receptor positive status [ER+]: Secondary | ICD-10-CM | POA: Insufficient documentation

## 2023-05-10 DIAGNOSIS — Z923 Personal history of irradiation: Secondary | ICD-10-CM | POA: Diagnosis not present

## 2023-05-10 DIAGNOSIS — C50411 Malignant neoplasm of upper-outer quadrant of right female breast: Secondary | ICD-10-CM | POA: Diagnosis not present

## 2023-05-10 MED ORDER — ANASTROZOLE 1 MG PO TABS: 1.0000 mg | ORAL_TABLET | Freq: Every day | ORAL | 3 refills | Status: DC

## 2023-05-10 NOTE — Progress Notes (Signed)
Patient Care Team: Farris Has, MD as PCP - General (Family Medicine) Serena Croissant, MD as Consulting Physician (Hematology and Oncology) Dorothy Puffer, MD as Consulting Physician (Radiation Oncology) Harriette Bouillon, MD as Consulting Physician (General Surgery) Axel Filler Larna Daughters, NP as Nurse Practitioner (Hematology and Oncology)  DIAGNOSIS:  Encounter Diagnoses  Name Primary?   Malignant neoplasm of upper-outer quadrant of right breast in female, estrogen receptor positive ([facility]) Yes   Post-menopausal     SUMMARY OF ONCOLOGIC HISTORY: Oncology History  Malignant neoplasm of upper-outer quadrant of right breast in female, estrogen receptor positive ([facility])  04/19/2017 Surgery   Right lumpectomy: IDC grade 2, 1.3 cm, intermediate grade DCIS, margins negative, 0/2 lymph nodes negative, ER 100%, PR 100%, HER-2 negative ratio 1.15, Ki-67 60%, T1 CN 0 stage IA   04/19/2017 Oncotype testing   Oncotype score 26: Risk of distant recurrence 17% with hormonal therapy alone; patient did not want to undergo chemotherapy for a 4% benefit   06/05/2017 - 07/04/2017 Radiation Therapy   Adjuvant radiation therapy   08/2017 -  Anti-estrogen oral therapy   Anastrozole daily     CHIEF COMPLIANT:   Discussed the use of AI scribe software for clinical note transcription with the patient, who gave verbal consent to proceed.  History of Present Illness   Sunita, a breast cancer survivor, presents for a routine follow-up. She reports no new health concerns and is tolerating her current anastrozole treatment well. She had a mammogram on August 19th, which showed no concerning findings. Her breasts are not overly dense, which is beneficial for the accuracy of mammogram results. She is active and enjoys traveling and creating art. She recently traveled to [place] and plans to travel to [place] next year. She also participates in local art events.         ALLERGIES:  has No Known  Allergies.  MEDICATIONS:  Current Outpatient Medications  Medication Sig Dispense Refill   levothyroxine (SYNTHROID, LEVOTHROID) 125 MCG tablet Take 125 mcg by mouth daily before breakfast.     Multiple Vitamin (MULTIVITAMIN WITH MINERALS) TABS tablet Take 1 tablet by mouth daily.     naproxen sodium (ANAPROX) 220 MG tablet Take 220 mg by mouth as needed.     anastrozole (ARIMIDEX) 1 MG tablet Take 1 tablet (1 mg total) by mouth daily. 90 tablet 3   No current facility-administered medications for this visit.    PHYSICAL EXAMINATION: ECOG PERFORMANCE STATUS: 1 - Symptomatic but completely ambulatory  Vitals:   05/10/23 0916  BP: 125/63  Pulse: 87  Resp: 18  Temp: (!) 97.5 F (36.4 C)  SpO2: 93%   Filed Weights   05/10/23 0916  Weight: 155 lb 4.8 oz (70.4 kg)      LABORATORY DATA:  I have reviewed the data as listed     No data to display            ASSESSMENT & PLAN:  Malignant neoplasm of upper-outer quadrant of right breast in female, estrogen receptor positive ([facility]) 04/19/2017: Right lumpectomy: IDC grade 2, 1.3 cm, intermediate grade DCIS, margins negative, 0/2 lymph nodes negative, ER 100%, PR 100%, HER-2 negative ratio 1.15, Ki-67 60%, T1 CN 0 stage IA Oncotype DX recurrence score 26: Distant recurrence rate 17% with hormone therapy alone Adjuvant radiation therapy 06/05/2017 to 07/04/2018   Current treatment: Anastrozole 1 mg daily started August 22, 2017. Recommended duration: 7 years Anastrozole toxicities: Mild hot flashes Bone density 05/12/2020: T score -0.9: Normal.  We  will need another bone density to be done.   Breast cancer surveillance: 1.  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  Return to clinic in  1 year for follow-up ------------------------------------- Assessment and Plan    Breast Cancer Survivorship No new concerns or symptoms. Recent mammogram on April 10, 2023, was normal. Patient tolerating Anastrozole well. -Continue Anastrozole. -Order bone density test to be completed in the first week of November 2024 to monitor for potential side effects of Anastrozole. -Resend a year's worth of Anastrozole refills to [facility] on [address]. -Plan for annual follow-up visit.          Orders Placed This Encounter  Procedures   DG Bone Density    Standing Status:   Future    Standing Expiration Date:   05/09/2024    Order Specific Question:   Reason for Exam (SYMPTOM  OR DIAGNOSIS REQUIRED)    Answer:   post menopausal    Order Specific Question:   Preferred imaging location?    Answer:   [facility]   The patient has a good understanding of the overall plan. she agrees with it. she will call with any problems that may develop before the next visit here. Total time spent: 30 mins including face to face time and time spent for planning, charting and co-ordination of care   Tamsen Meek, MD 05/10/23

## 2023-05-10 NOTE — Assessment & Plan Note (Addendum)
04/19/2017: Right lumpectomy: IDC grade 2, 1.3 cm, intermediate grade DCIS, margins negative, 0/2 lymph nodes negative, ER 100%, PR 100%, HER-2 negative ratio 1.15, Ki-67 60%, T1 CN 0 stage IA Oncotype DX recurrence score 26: Distant recurrence rate 17% with hormone therapy alone Adjuvant radiation therapy 06/05/2017 to 07/04/2018   Current treatment: Anastrozole 1 mg daily started August 22, 2017. Recommended duration: 7 years Anastrozole toxicities: Mild hot flashes Bone density 05/12/2020: T score -0.9: Normal.  We will need another bone density to be done.   Breast cancer surveillance: 1.  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  Return to clinic in 1 year for follow-up

## 2023-05-22 DIAGNOSIS — D492 Neoplasm of unspecified behavior of bone, soft tissue, and skin: Secondary | ICD-10-CM | POA: Diagnosis not present

## 2023-05-22 DIAGNOSIS — Z872 Personal history of diseases of the skin and subcutaneous tissue: Secondary | ICD-10-CM | POA: Diagnosis not present

## 2023-05-22 DIAGNOSIS — D0439 Carcinoma in situ of skin of other parts of face: Secondary | ICD-10-CM | POA: Diagnosis not present

## 2023-05-22 DIAGNOSIS — Z85828 Personal history of other malignant neoplasm of skin: Secondary | ICD-10-CM | POA: Diagnosis not present

## 2023-05-22 DIAGNOSIS — L814 Other melanin hyperpigmentation: Secondary | ICD-10-CM | POA: Diagnosis not present

## 2023-05-22 DIAGNOSIS — L821 Other seborrheic keratosis: Secondary | ICD-10-CM | POA: Diagnosis not present

## 2023-05-22 DIAGNOSIS — Z09 Encounter for follow-up examination after completed treatment for conditions other than malignant neoplasm: Secondary | ICD-10-CM | POA: Diagnosis not present

## 2023-05-22 DIAGNOSIS — Z08 Encounter for follow-up examination after completed treatment for malignant neoplasm: Secondary | ICD-10-CM | POA: Diagnosis not present

## 2023-05-22 DIAGNOSIS — D225 Melanocytic nevi of trunk: Secondary | ICD-10-CM | POA: Diagnosis not present

## 2023-06-22 DIAGNOSIS — Z23 Encounter for immunization: Secondary | ICD-10-CM | POA: Diagnosis not present

## 2023-06-26 DIAGNOSIS — D0439 Carcinoma in situ of skin of other parts of face: Secondary | ICD-10-CM | POA: Diagnosis not present

## 2023-07-25 ENCOUNTER — Ambulatory Visit (HOSPITAL_BASED_OUTPATIENT_CLINIC_OR_DEPARTMENT_OTHER)
Admission: RE | Admit: 2023-07-25 | Discharge: 2023-07-25 | Disposition: A | Discharge status: Home / Self Care | Payer: Medicare PPO | Source: Ambulatory Visit | Attending: Hematology and Oncology | Admitting: Hematology and Oncology

## 2023-07-25 DIAGNOSIS — Z780 Asymptomatic menopausal state: Secondary | ICD-10-CM | POA: Insufficient documentation

## 2023-07-25 DIAGNOSIS — M85832 Other specified disorders of bone density and structure, left forearm: Secondary | ICD-10-CM | POA: Diagnosis not present

## 2023-08-31 DIAGNOSIS — L570 Actinic keratosis: Secondary | ICD-10-CM | POA: Diagnosis not present

## 2023-11-20 DIAGNOSIS — Z85828 Personal history of other malignant neoplasm of skin: Secondary | ICD-10-CM | POA: Diagnosis not present

## 2023-11-20 DIAGNOSIS — Z08 Encounter for follow-up examination after completed treatment for malignant neoplasm: Secondary | ICD-10-CM | POA: Diagnosis not present

## 2023-11-20 DIAGNOSIS — L578 Other skin changes due to chronic exposure to nonionizing radiation: Secondary | ICD-10-CM | POA: Diagnosis not present

## 2023-11-20 DIAGNOSIS — Z872 Personal history of diseases of the skin and subcutaneous tissue: Secondary | ICD-10-CM | POA: Diagnosis not present

## 2023-11-20 DIAGNOSIS — Z09 Encounter for follow-up examination after completed treatment for conditions other than malignant neoplasm: Secondary | ICD-10-CM | POA: Diagnosis not present

## 2024-02-26 ENCOUNTER — Other Ambulatory Visit: Payer: Self-pay | Admitting: Hematology and Oncology

## 2024-02-26 DIAGNOSIS — Z1231 Encounter for screening mammogram for malignant neoplasm of breast: Secondary | ICD-10-CM

## 2024-04-04 DIAGNOSIS — E559 Vitamin D deficiency, unspecified: Secondary | ICD-10-CM | POA: Diagnosis not present

## 2024-04-04 DIAGNOSIS — Z23 Encounter for immunization: Secondary | ICD-10-CM | POA: Diagnosis not present

## 2024-04-04 DIAGNOSIS — R7309 Other abnormal glucose: Secondary | ICD-10-CM | POA: Diagnosis not present

## 2024-04-04 DIAGNOSIS — Z0000 Encounter for general adult medical examination without abnormal findings: Secondary | ICD-10-CM | POA: Diagnosis not present

## 2024-04-04 DIAGNOSIS — E039 Hypothyroidism, unspecified: Secondary | ICD-10-CM | POA: Diagnosis not present

## 2024-04-04 DIAGNOSIS — E782 Mixed hyperlipidemia: Secondary | ICD-10-CM | POA: Diagnosis not present

## 2024-04-04 DIAGNOSIS — Z853 Personal history of malignant neoplasm of breast: Secondary | ICD-10-CM | POA: Diagnosis not present

## 2024-04-08 ENCOUNTER — Ambulatory Visit
Admission: RE | Admit: 2024-04-08 | Discharge: 2024-04-08 | Disposition: A | Discharge status: Home / Self Care | Source: Ambulatory Visit | Attending: Hematology and Oncology | Admitting: Hematology and Oncology

## 2024-04-08 DIAGNOSIS — Z1231 Encounter for screening mammogram for malignant neoplasm of breast: Secondary | ICD-10-CM | POA: Diagnosis not present

## 2024-05-09 ENCOUNTER — Inpatient Hospital Stay: Payer: Medicare PPO | Attending: Hematology and Oncology | Admitting: Hematology and Oncology

## 2024-05-09 VITALS — BP 119/61 | HR 86 | Temp 97.6°F | Resp 16 | Wt 152.8 lb

## 2024-05-09 DIAGNOSIS — C50411 Malignant neoplasm of upper-outer quadrant of right female breast: Secondary | ICD-10-CM | POA: Diagnosis not present

## 2024-05-09 DIAGNOSIS — Z1732 Human epidermal growth factor receptor 2 negative status: Secondary | ICD-10-CM | POA: Insufficient documentation

## 2024-05-09 DIAGNOSIS — Z170 Estrogen receptor positive status [ER+]: Secondary | ICD-10-CM | POA: Insufficient documentation

## 2024-05-09 DIAGNOSIS — Z1721 Progesterone receptor positive status: Secondary | ICD-10-CM | POA: Insufficient documentation

## 2024-05-09 DIAGNOSIS — Z923 Personal history of irradiation: Secondary | ICD-10-CM | POA: Insufficient documentation

## 2024-05-09 DIAGNOSIS — M8580 Other specified disorders of bone density and structure, unspecified site: Secondary | ICD-10-CM | POA: Diagnosis not present

## 2024-05-09 DIAGNOSIS — Z79811 Long term (current) use of aromatase inhibitors: Secondary | ICD-10-CM | POA: Insufficient documentation

## 2024-05-09 MED ORDER — ANASTROZOLE 1 MG PO TABS: 1.0000 mg | ORAL_TABLET | Freq: Every day | ORAL | 3 refills | Status: AC

## 2024-05-09 NOTE — Assessment & Plan Note (Signed)
 04/19/2017: Right lumpectomy: IDC grade 2, 1.3 cm, intermediate grade DCIS, margins negative, 0/2 lymph nodes negative, ER 100%, PR 100%, HER-2 negative ratio 1.15, Ki-67 60%, T1 CN 0 stage IA Oncotype DX recurrence score 26: Distant recurrence rate 17% with hormone therapy alone Adjuvant radiation therapy 06/05/2017 to 07/04/2018   Current treatment: Anastrozole  1 mg daily started August 22, 2017. Recommended duration: 7 years Anastrozole  toxicities: Mild hot flashes Bone density 05/12/2020: T score -0.9: Normal.  We will need another bone density to be done.   Breast cancer surveillance: 1.  Mammogram 04/10/2024: No evidence of malignancy, breast density category B 2.  Breast exam 05/09/2024: Benign 3.  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   Return to clinic in 1 year for follow-up

## 2024-05-09 NOTE — Progress Notes (Signed)
 Patient Care Team: Kip Righter, MD as PCP - General (Family Medicine) Odean Potts, MD as Consulting Physician (Hematology and Oncology) Dewey Rush, MD as Consulting Physician (Radiation Oncology) Vanderbilt Ned, MD as Consulting Physician (General Surgery) Crawford Morna Pickle, NP as Nurse Practitioner (Hematology and Oncology)  DIAGNOSIS:  Encounter Diagnosis  Name Primary?   Malignant neoplasm of upper-outer quadrant of right breast in female, estrogen receptor positive ([facility]) Yes    SUMMARY OF ONCOLOGIC HISTORY: Oncology History  Malignant neoplasm of upper-outer quadrant of right breast in female, estrogen receptor positive ([facility])  04/19/2017 Surgery   Right lumpectomy: IDC grade 2, 1.3 cm, intermediate grade DCIS, margins negative, 0/2 lymph nodes negative, ER 100%, PR 100%, HER-2 negative ratio 1.15, Ki-67 60%, T1 CN 0 stage IA   04/19/2017 Oncotype testing   Oncotype score 26: Risk of distant recurrence 17% with hormonal therapy alone; patient did not want to undergo chemotherapy for a 4% benefit   06/05/2017 - 07/04/2017 Radiation Therapy   Adjuvant radiation therapy   08/2017 -  Anti-estrogen oral therapy   Anastrozole  daily     CHIEF COMPLIANT:   HISTORY OF PRESENT ILLNESS:   History of Present Illness Erica Frazier is an 83 year old female with estrogen receptor-positive breast cancer who presents for a routine follow-up visit.  She has been on Anastrozole  for six years for estrogen receptor-positive breast cancer in the upper-outer quadrant of the right breast, with one year of treatment remaining. She has two doses left of her current medication, though the specific name, dose, or frequency was not mentioned.     ALLERGIES:  has no known allergies.  MEDICATIONS:  Current Outpatient Medications  Medication Sig Dispense Refill   anastrozole  (ARIMIDEX ) 1 MG tablet Take 1 tablet (1 mg total) by mouth daily. 90 tablet 3   levothyroxine (SYNTHROID,  LEVOTHROID) 125 MCG tablet Take 125 mcg by mouth daily before breakfast.     Multiple Vitamin (MULTIVITAMIN WITH MINERALS) TABS tablet Take 1 tablet by mouth daily.     naproxen sodium (ANAPROX) 220 MG tablet Take 220 mg by mouth as needed.     No current facility-administered medications for this visit.    PHYSICAL EXAMINATION: ECOG PERFORMANCE STATUS: 1 - Symptomatic but completely ambulatory  Vitals:   05/09/24 0858  BP: 119/61  Pulse: 86  Resp: 16  Temp: 97.6 F (36.4 C)  SpO2: 97%   Filed Weights   05/09/24 0858  Weight: 152 lb 12.8 oz (69.3 kg)    Physical Exam   (exam performed in the presence of a chaperone)  LABORATORY DATA:  I have reviewed the data as listed     No data to display          No results found for: WBC, HGB, HCT, MCV, PLT, NEUTROABS  ASSESSMENT & PLAN:  Malignant neoplasm of upper-outer quadrant of right breast in female, estrogen receptor positive ([facility]) 04/19/2017: Right lumpectomy: IDC grade 2, 1.3 cm, intermediate grade DCIS, margins negative, 0/2 lymph nodes negative, ER 100%, PR 100%, HER-2 negative ratio 1.15, Ki-67 60%, T1 CN 0 stage IA Oncotype DX recurrence score 26: Distant recurrence rate 17% with hormone therapy alone Adjuvant radiation therapy 06/05/2017 to 07/04/2018   Current treatment: Anastrozole  1 mg daily started August 22, 2017. Recommended duration: 7 years Anastrozole  toxicities: Mild hot flashes Bone density 05/12/2020: T score -0.9: Normal.  We will need another bone density to be done.   Breast cancer surveillance: 1.  Mammogram 04/10/2024: No  evidence of malignancy, breast density category B 2.  Breast exam 05/09/2024: Benign 3.  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   Return  to clinic in 1 year for follow-up ------------------------------------- Assessment and Plan Assessment & Plan Malignant neoplasm of upper-outer quadrant of right breast, ER positive Estrogen receptor-positive breast cancer in the upper-outer quadrant of the right breast. Post-menopausal, on Anastrozole  for six years, one year remaining. Recent mammograms show low breast density, normal results, no new findings. - Continue Anastrozole  1 mg orally daily for one more year. - Ensure annual mammograms are performed and reviewed.      No orders of the defined types were placed in this encounter.  The patient has a good understanding of the overall plan. she agrees with it. she will call with any problems that may develop before the next visit here. Total time spent: 30 mins including face to face time and time spent for planning, charting and co-ordination of care   Naomi MARLA Chad, MD 05/09/24

## 2024-05-20 DIAGNOSIS — H52223 Regular astigmatism, bilateral: Secondary | ICD-10-CM | POA: Diagnosis not present

## 2024-05-20 DIAGNOSIS — H3562 Retinal hemorrhage, left eye: Secondary | ICD-10-CM | POA: Diagnosis not present

## 2024-05-20 DIAGNOSIS — H35373 Puckering of macula, bilateral: Secondary | ICD-10-CM | POA: Diagnosis not present

## 2024-05-20 DIAGNOSIS — Z961 Presence of intraocular lens: Secondary | ICD-10-CM | POA: Diagnosis not present

## 2024-05-20 DIAGNOSIS — H5202 Hypermetropia, left eye: Secondary | ICD-10-CM | POA: Diagnosis not present

## 2024-05-20 DIAGNOSIS — H524 Presbyopia: Secondary | ICD-10-CM | POA: Diagnosis not present

## 2024-05-21 DIAGNOSIS — D225 Melanocytic nevi of trunk: Secondary | ICD-10-CM | POA: Diagnosis not present

## 2024-05-21 DIAGNOSIS — Z872 Personal history of diseases of the skin and subcutaneous tissue: Secondary | ICD-10-CM | POA: Diagnosis not present

## 2024-05-21 DIAGNOSIS — Z08 Encounter for follow-up examination after completed treatment for malignant neoplasm: Secondary | ICD-10-CM | POA: Diagnosis not present

## 2024-05-21 DIAGNOSIS — Z85828 Personal history of other malignant neoplasm of skin: Secondary | ICD-10-CM | POA: Diagnosis not present

## 2024-05-21 DIAGNOSIS — L821 Other seborrheic keratosis: Secondary | ICD-10-CM | POA: Diagnosis not present

## 2024-05-21 DIAGNOSIS — L814 Other melanin hyperpigmentation: Secondary | ICD-10-CM | POA: Diagnosis not present

## 2024-06-20 ENCOUNTER — Other Ambulatory Visit: Payer: Self-pay

## 2024-06-20 ENCOUNTER — Encounter (HOSPITAL_BASED_OUTPATIENT_CLINIC_OR_DEPARTMENT_OTHER): Payer: Self-pay

## 2024-06-20 ENCOUNTER — Emergency Department (HOSPITAL_BASED_OUTPATIENT_CLINIC_OR_DEPARTMENT_OTHER)
Admission: EM | Admit: 2024-06-20 | Discharge: 2024-06-20 | Disposition: A | Discharge status: Home / Self Care | Source: Ambulatory Visit | Attending: Emergency Medicine | Admitting: Emergency Medicine

## 2024-06-20 ENCOUNTER — Emergency Department (HOSPITAL_BASED_OUTPATIENT_CLINIC_OR_DEPARTMENT_OTHER)

## 2024-06-20 DIAGNOSIS — R5383 Other fatigue: Secondary | ICD-10-CM | POA: Diagnosis not present

## 2024-06-20 DIAGNOSIS — R42 Dizziness and giddiness: Secondary | ICD-10-CM | POA: Diagnosis not present

## 2024-06-20 DIAGNOSIS — R9431 Abnormal electrocardiogram [ECG] [EKG]: Secondary | ICD-10-CM | POA: Diagnosis not present

## 2024-06-20 DIAGNOSIS — I700 Atherosclerosis of aorta: Secondary | ICD-10-CM | POA: Diagnosis not present

## 2024-06-20 DIAGNOSIS — R0789 Other chest pain: Secondary | ICD-10-CM | POA: Diagnosis present

## 2024-06-20 DIAGNOSIS — E039 Hypothyroidism, unspecified: Secondary | ICD-10-CM | POA: Insufficient documentation

## 2024-06-20 DIAGNOSIS — R079 Chest pain, unspecified: Secondary | ICD-10-CM | POA: Diagnosis not present

## 2024-06-20 LAB — CBC WITH DIFFERENTIAL/PLATELET
Abs Immature Granulocytes: 0.02 K/uL (ref 0.00–0.07)
Basophils Absolute: 0.1 K/uL (ref 0.0–0.1)
Basophils Relative: 1 %
Eosinophils Absolute: 0.1 K/uL (ref 0.0–0.5)
Eosinophils Relative: 2 %
HCT: 45.4 % (ref 36.0–46.0)
Hemoglobin: 15.3 g/dL — ABNORMAL HIGH (ref 12.0–15.0)
Immature Granulocytes: 0 %
Lymphocytes Relative: 20 %
Lymphs Abs: 1.5 K/uL (ref 0.7–4.0)
MCH: 32.6 pg (ref 26.0–34.0)
MCHC: 33.7 g/dL (ref 30.0–36.0)
MCV: 96.8 fL (ref 80.0–100.0)
Monocytes Absolute: 0.5 K/uL (ref 0.1–1.0)
Monocytes Relative: 7 %
Neutro Abs: 5.3 K/uL (ref 1.7–7.7)
Neutrophils Relative %: 70 %
Platelets: 292 K/uL (ref 150–400)
RBC: 4.69 MIL/uL (ref 3.87–5.11)
RDW: 12.8 % (ref 11.5–15.5)
WBC: 7.6 K/uL (ref 4.0–10.5)
nRBC: 0 % (ref 0.0–0.2)

## 2024-06-20 LAB — BASIC METABOLIC PANEL WITH GFR
Anion gap: 10 (ref 5–15)
BUN: 15 mg/dL (ref 8–23)
CO2: 26 mmol/L (ref 22–32)
Calcium: 10.2 mg/dL (ref 8.9–10.3)
Chloride: 103 mmol/L (ref 98–111)
Creatinine, Ser: 0.79 mg/dL (ref 0.44–1.00)
GFR, Estimated: >60 mL/min (ref >60)
Glucose, Bld: 138 mg/dL — ABNORMAL HIGH (ref 70–99)
Potassium: 4.3 mmol/L (ref 3.5–5.1)
Sodium: 139 mmol/L (ref 135–145)

## 2024-06-20 LAB — D-DIMER, QUANTITATIVE: D-Dimer, Quant: 0.42 ug{FEU}/mL (ref 0.00–0.50)

## 2024-06-20 LAB — RESP PANEL BY RT-PCR (RSV, FLU A&B, COVID)  RVPGX2
Influenza A by PCR: NEGATIVE
Influenza B by PCR: NEGATIVE
Resp Syncytial Virus by PCR: NEGATIVE
SARS Coronavirus 2 by RT PCR: NEGATIVE

## 2024-06-20 LAB — CBG MONITORING, ED: Glucose-Capillary: 136 mg/dL — ABNORMAL HIGH (ref 70–99)

## 2024-06-20 LAB — TSH: TSH: 3.78 u[IU]/mL (ref 0.350–4.500)

## 2024-06-20 LAB — TROPONIN T, HIGH SENSITIVITY
Troponin T High Sensitivity: <15 ng/L (ref 0–19)
Troponin T High Sensitivity: <15 ng/L (ref 0–19)

## 2024-06-20 NOTE — ED Triage Notes (Addendum)
 Patient reports feeling some dizziness, weakness, and overall not feeling well. She said this has been going on for a couple days. She was seen at [facility] and they recommend she come here. She took a nap which is unusual for her as she is usually active and goes to the gym. Also reports some chest heaviness.

## 2024-06-20 NOTE — Discharge Instructions (Addendum)
 Today you were seen for chest heaviness and fatigue.  Your workup while in the emergency department was reassuring.  Please follow-up with your primary care if your symptoms persist for further evaluation and workup.  Return to the ED if your symptoms worsen, you have severe chest pain, or worsening dizziness.  Thank you for letting us  treat you today. After reviewing your labs and imaging, I feel you are safe to go home. Please follow up with your PCP in the next several days and provide them with your records from this visit. Return to the Emergency Room if pain becomes severe or symptoms worsen.

## 2024-06-20 NOTE — ED Provider Notes (Signed)
 [place] EMERGENCY DEPARTMENT AT [facility] Provider Note   CSN: [id number] Arrival date & time: 06/20/24  1443     Patient presents with: Dizziness and Weakness   Erica Frazier is a 83 y.o. female has medical history significant for hypothyroidism presents today for generalized fatigue and chest heaviness.  Patient reports she did have an episode of dizziness a few days ago and took a nap which is unlike her, but did not experience any dizziness after that.  Patient reports maybe some increase in congestion over the last few weeks.  Patient denies fever, chills, nausea, vomiting, numbness, weakness, diplopia, tinnitus, trauma, shortness of breath, or abdominal pain.    Dizziness Associated symptoms: chest pain and weakness   Weakness Associated symptoms: chest pain and dizziness        Prior to Admission medications   Medication Sig Start Date End Date Taking? Authorizing Provider  anastrozole  (ARIMIDEX ) 1 MG tablet Take 1 tablet (1 mg total) by mouth daily. 05/09/24   Gudena, Vinay, MD  levothyroxine (SYNTHROID, LEVOTHROID) 125 MCG tablet Take 125 mcg by mouth daily before breakfast.    [provider]  Multiple Vitamin (MULTIVITAMIN WITH MINERALS) TABS tablet Take 1 tablet by mouth daily.    [provider]  naproxen sodium (ANAPROX) 220 MG tablet Take 220 mg by mouth as needed.    [provider]    Allergies: Patient has no known allergies.    Review of Systems  HENT:  Positive for congestion.   Cardiovascular:  Positive for chest pain.  Neurological:  Positive for dizziness and weakness.    Updated Vital Signs BP 129/68   Pulse 65   Temp 97.8 F (36.6 C) (Oral)   Resp 18   SpO2 95%   Physical Exam Vitals and nursing note reviewed.  Constitutional:      General: She is not in acute distress.    Appearance: She is well-developed. She is not toxic-appearing.  HENT:     Head: Normocephalic and atraumatic.     Right  Ear: External ear normal.     Left Ear: External ear normal.     Nose: Nose normal.  Eyes:     Extraocular Movements: Extraocular movements intact.     Conjunctiva/sclera: Conjunctivae normal.     Pupils: Pupils are equal, round, and reactive to light.  Cardiovascular:     Rate and Rhythm: Normal rate and regular rhythm.     Pulses: Normal pulses.     Heart sounds: Normal heart sounds. No murmur heard. Pulmonary:     Effort: Pulmonary effort is normal. No respiratory distress.     Breath sounds: Normal breath sounds.  Abdominal:     Palpations: Abdomen is soft.     Tenderness: There is no abdominal tenderness.  Musculoskeletal:        General: No swelling.     Cervical back: Neck supple.  Skin:    General: Skin is warm and dry.     Capillary Refill: Capillary refill takes less than 2 seconds.  Neurological:     General: No focal deficit present.     Mental Status: She is alert and oriented to person, place, and time.     Cranial Nerves: No cranial nerve deficit.     Sensory: No sensory deficit.     Motor: No weakness.     Coordination: Coordination normal.  Psychiatric:        Mood and Affect: Mood normal.     (  all labs ordered are listed, but only abnormal results are displayed) Labs Reviewed  BASIC METABOLIC PANEL WITH GFR - Abnormal; Notable for the following components:      Result Value   Glucose, Bld 138 (*)    All other components within normal limits  CBC WITH DIFFERENTIAL/PLATELET - Abnormal; Notable for the following components:   Hemoglobin 15.3 (*)    All other components within normal limits  CBG MONITORING, ED - Abnormal; Notable for the following components:   Glucose-Capillary 136 (*)    All other components within normal limits  RESP PANEL BY RT-PCR (RSV, FLU A&B, COVID)  [id number]  D-DIMER, QUANTITATIVE  TSH  TROPONIN T, HIGH SENSITIVITY  TROPONIN T, HIGH SENSITIVITY    EKG: EKG Interpretation Date/Time:  Thursday June 20 2024 14:56:22  EDT Ventricular Rate:  88 PR Interval:  142 QRS Duration:  84 QT Interval:  346 QTC Calculation: 418 R Axis:   74  Text Interpretation: Normal sinus rhythm Nonspecific ST and T wave abnormality Abnormal ECG When compared with ECG of 16-Aug-2002 18:31, when compared to prior, overall similar appearance no STEMI Confirmed by Ginger Barefoot ([id number]) on 06/20/2024 6:26:16 PM  Radiology: CT Head Wo Contrast Result Date: 06/20/2024 EXAM: CT HEAD WITHOUT CONTRAST 06/20/2024 05:37:42 PM TECHNIQUE: CT of the head was performed without the administration of intravenous contrast. Automated exposure control, iterative reconstruction, and/or weight based adjustment of the mA/kV was utilized to reduce the radiation dose to as low as reasonably achievable. COMPARISON: None available. CLINICAL HISTORY: Dizziness. FINDINGS: BRAIN AND VENTRICLES: No acute hemorrhage. No evidence of acute infarct. No hydrocephalus. No extra-axial collection. No mass effect or midline shift. ORBITS: No acute abnormality. SINUSES: No acute abnormality. SOFT TISSUES AND SKULL: No acute soft tissue abnormality. No skull fracture. IMPRESSION: 1. No acute intracranial abnormality. Electronically signed by: Luke Bun MD 06/20/2024 05:44 PM EDT RP Workstation: [id number]   DG Chest Port 1 View Result Date: 06/20/2024 CLINICAL DATA:  Chest pain. EXAM: PORTABLE CHEST 1 VIEW COMPARISON:  None Available. FINDINGS: No focal consolidation, pleural effusion, or pneumothorax. The cardiac silhouette is within normal limits. Atherosclerotic calcification of the aorta. No acute osseous pathology. IMPRESSION: No active disease. Electronically Signed   By: Vanetta Chou M.D.   On: 06/20/2024 17:23     Procedures   Medications Ordered in the ED - No data to display                                  Medical Decision Making Amount and/or Complexity of Data Reviewed Labs: ordered. Radiology: ordered.   This patient presents to the ED for  concern of fatigue, this involves an extensive number of treatment options, and is a complaint that carries with it a high risk of complications and morbidity.  The differential diagnosis includes STEMI, NSTEMI, anemia, electrolyte abnormality, PE, COVID, flu, RSV, hypothyroidism, pneumonia   Lab Tests:  I Ordered, and personally interpreted labs.  The pertinent results include: BMP unremarkable, delta troponin less than 15, mildly elevated hemoglobin at 15.3, negative D-dimer, TSH 3.78   Imaging Studies ordered:  I ordered imaging studies including CT head Noncon I independently visualized and interpreted imaging which showed no acute intracranial abnormality I agree with the radiologist interpretation Chest x-ray with shift no active disease   Cardiac Monitoring: / EKG:  The patient was maintained on a cardiac monitor.  I personally viewed and interpreted the cardiac  monitored which showed an underlying rhythm of: Sinus rhythm, nonspecific ST and T wave abnormalities  Test / Admission - Considered:  Considered for admission or further workup however patient's vital signs, physical exam, labs, and imaging are reassuring.  Patient advised to follow-up with her primary care if her symptoms persist for further evaluation workup.  Patient given return precautions.  I feel patient safe for discharge at this time.     Final diagnoses:  Other fatigue  Chest heaviness    ED Discharge Orders     None          Francis Ileana LOISE DEVONNA 06/20/24 1829    Tegeler, Lonni PARAS, MD 06/20/24 2114

## 2024-06-20 NOTE — ED Notes (Signed)

## 2025-05-12 ENCOUNTER — Ambulatory Visit: Admitting: Hematology and Oncology
